# Patient Record
Sex: Male | Born: 1935
Health system: Southern US, Community
[De-identification: ages and names within clinical notes are randomized; demographics above are authoritative.]

## PROBLEM LIST (undated history)

## (undated) DIAGNOSIS — I10 Essential (primary) hypertension: Secondary | ICD-10-CM

## (undated) DIAGNOSIS — E785 Hyperlipidemia, unspecified: Secondary | ICD-10-CM

## (undated) DIAGNOSIS — C801 Malignant (primary) neoplasm, unspecified: Secondary | ICD-10-CM

---

## 2016-01-15 DIAGNOSIS — I10 Essential (primary) hypertension: Secondary | ICD-10-CM | POA: Diagnosis not present

## 2016-01-15 DIAGNOSIS — Z8546 Personal history of malignant neoplasm of prostate: Secondary | ICD-10-CM | POA: Diagnosis not present

## 2016-01-15 DIAGNOSIS — E782 Mixed hyperlipidemia: Secondary | ICD-10-CM | POA: Diagnosis not present

## 2016-01-15 DIAGNOSIS — R0602 Shortness of breath: Secondary | ICD-10-CM | POA: Diagnosis not present

## 2016-02-08 ENCOUNTER — Other Ambulatory Visit: Payer: Self-pay

## 2016-02-08 NOTE — Patient Outreach (Signed)
[facility]) Care Management  02/08/2016  Joshua Dixon [birth year] [id number]  Referral Date:  02/01/2016 Source:  [place] Rossetti [place] Issue:  "Mrs. Beets would like to speak with an in-home care Freight forwarder.  Would like to discuss [facility] & rehab resources."  Telephone Screen  Case Review:   Primary MD:  Dr. Josetta Huddle per chart and Dr. Cristy Folks per [place]. H/o hyperlipidemia, Essential HTN, H/o prostate cancer BP 118/66 Weight 159 Height: 72  BMI 21.56 Immunizations: unknown  Outreach call #1 to patient for Screening.  [phone] (H) and  provided by [facility] for wife.  Patient not reached. RN CM left HIPAA compliant voice message with name and number for call back. RN CM scheduled for next contact call within one week.   Mariann Laster, RN, BSN, [facility], CCM  [employer] Management Coordinator [phone] Direct [phone] Cell [phone] Office [phone] Fax

## 2016-02-11 ENCOUNTER — Other Ambulatory Visit: Payer: Self-pay

## 2016-02-11 DIAGNOSIS — R0789 Other chest pain: Secondary | ICD-10-CM | POA: Diagnosis not present

## 2016-02-11 DIAGNOSIS — F322 Major depressive disorder, single episode, severe without psychotic features: Secondary | ICD-10-CM | POA: Diagnosis not present

## 2016-02-11 DIAGNOSIS — I10 Essential (primary) hypertension: Secondary | ICD-10-CM | POA: Diagnosis not present

## 2016-02-11 DIAGNOSIS — Z8546 Personal history of malignant neoplasm of prostate: Secondary | ICD-10-CM | POA: Diagnosis not present

## 2016-02-11 DIAGNOSIS — R262 Difficulty in walking, not elsewhere classified: Secondary | ICD-10-CM | POA: Diagnosis not present

## 2016-02-11 NOTE — Patient Outreach (Signed)
[facility]) Care Management  02/11/2016  Raif Sarafin Mar 29, 1926 [id number]   Referral Date: 02/01/2016 Source: Mcarthur Rossetti [place] Issue: "Mrs. Ishee would like to speak with an in-home care Freight forwarder. Would like to discuss [facility] & rehab resources."  Telephone Screen  Case Review:  Primary MD: Dr. Josetta Huddle per chart and Dr. Cristy Folks per [place]. H/o hyperlipidemia, Essential HTN, H/o prostate cancer BP 118/66 Weight 159 Height: 72  BMI 21.56 Immunizations: unknown  Outreach call #2 to patient for Screening. [phone] (H). Patient not reached. RN CM left HIPAA compliant voice message with name and number for call back. RN CM scheduled for next contact call within one week.   Mariann Laster, RN, BSN, [facility], CCM  [employer] Management Coordinator [phone] Direct [phone] Cell [phone] Office [phone] Fax

## 2016-02-13 ENCOUNTER — Other Ambulatory Visit: Payer: Self-pay

## 2016-02-13 DIAGNOSIS — Z8546 Personal history of malignant neoplasm of prostate: Secondary | ICD-10-CM | POA: Diagnosis not present

## 2016-02-13 DIAGNOSIS — N183 Chronic kidney disease, stage 3 (moderate): Secondary | ICD-10-CM | POA: Diagnosis not present

## 2016-02-13 DIAGNOSIS — R262 Difficulty in walking, not elsewhere classified: Secondary | ICD-10-CM | POA: Diagnosis not present

## 2016-02-13 DIAGNOSIS — I129 Hypertensive chronic kidney disease with stage 1 through stage 4 chronic kidney disease, or unspecified chronic kidney disease: Secondary | ICD-10-CM | POA: Diagnosis not present

## 2016-02-13 DIAGNOSIS — F322 Major depressive disorder, single episode, severe without psychotic features: Secondary | ICD-10-CM | POA: Diagnosis not present

## 2016-02-13 NOTE — Patient Outreach (Signed)
[facility]) Care Management  02/13/2016  Natrone Mcquistion 10-Nov-1925 [id number]  Telephone Screen  Referral Date:  02/01/2016 Source:  [place] Issue: Patient would like to speak with an in-home care manager.   Outreach call #3 to patient for Screening at [phone] (H). Patient's wife, caregiver: Pascal Dey reached. Wife refused to verify DOB and address.   RN CM attempted to introduce [facility] services.  Patient stated she would not provide any additional information.   Plan:  RN CM notified [place] Management Assistant to close case: refused. RN CM notified Primary MD: Dr. Seward Carol (per [facility] referral) via case closure letter.  RN CM sent case closure letter to patient for additional verification.  Mariann Laster, RN, BSN, [facility], CCM  [employer] Management Coordinator [phone] Direct [phone] Cell [phone] Office [phone] Fax

## 2016-02-18 DIAGNOSIS — N183 Chronic kidney disease, stage 3 (moderate): Secondary | ICD-10-CM | POA: Diagnosis not present

## 2016-02-18 DIAGNOSIS — I129 Hypertensive chronic kidney disease with stage 1 through stage 4 chronic kidney disease, or unspecified chronic kidney disease: Secondary | ICD-10-CM | POA: Diagnosis not present

## 2016-02-18 DIAGNOSIS — Z8546 Personal history of malignant neoplasm of prostate: Secondary | ICD-10-CM | POA: Diagnosis not present

## 2016-02-18 DIAGNOSIS — R262 Difficulty in walking, not elsewhere classified: Secondary | ICD-10-CM | POA: Diagnosis not present

## 2016-02-18 DIAGNOSIS — F322 Major depressive disorder, single episode, severe without psychotic features: Secondary | ICD-10-CM | POA: Diagnosis not present

## 2016-02-22 DIAGNOSIS — I129 Hypertensive chronic kidney disease with stage 1 through stage 4 chronic kidney disease, or unspecified chronic kidney disease: Secondary | ICD-10-CM | POA: Diagnosis not present

## 2016-02-22 DIAGNOSIS — Z8546 Personal history of malignant neoplasm of prostate: Secondary | ICD-10-CM | POA: Diagnosis not present

## 2016-02-22 DIAGNOSIS — F322 Major depressive disorder, single episode, severe without psychotic features: Secondary | ICD-10-CM | POA: Diagnosis not present

## 2016-02-22 DIAGNOSIS — R262 Difficulty in walking, not elsewhere classified: Secondary | ICD-10-CM | POA: Diagnosis not present

## 2016-02-22 DIAGNOSIS — N183 Chronic kidney disease, stage 3 (moderate): Secondary | ICD-10-CM | POA: Diagnosis not present

## 2016-02-25 DIAGNOSIS — N183 Chronic kidney disease, stage 3 (moderate): Secondary | ICD-10-CM | POA: Diagnosis not present

## 2016-02-25 DIAGNOSIS — Z8546 Personal history of malignant neoplasm of prostate: Secondary | ICD-10-CM | POA: Diagnosis not present

## 2016-02-25 DIAGNOSIS — F322 Major depressive disorder, single episode, severe without psychotic features: Secondary | ICD-10-CM | POA: Diagnosis not present

## 2016-02-25 DIAGNOSIS — I129 Hypertensive chronic kidney disease with stage 1 through stage 4 chronic kidney disease, or unspecified chronic kidney disease: Secondary | ICD-10-CM | POA: Diagnosis not present

## 2016-02-25 DIAGNOSIS — R262 Difficulty in walking, not elsewhere classified: Secondary | ICD-10-CM | POA: Diagnosis not present

## 2016-02-27 DIAGNOSIS — I129 Hypertensive chronic kidney disease with stage 1 through stage 4 chronic kidney disease, or unspecified chronic kidney disease: Secondary | ICD-10-CM | POA: Diagnosis not present

## 2016-02-27 DIAGNOSIS — R262 Difficulty in walking, not elsewhere classified: Secondary | ICD-10-CM | POA: Diagnosis not present

## 2016-02-27 DIAGNOSIS — N183 Chronic kidney disease, stage 3 (moderate): Secondary | ICD-10-CM | POA: Diagnosis not present

## 2016-02-27 DIAGNOSIS — F322 Major depressive disorder, single episode, severe without psychotic features: Secondary | ICD-10-CM | POA: Diagnosis not present

## 2016-02-27 DIAGNOSIS — Z8546 Personal history of malignant neoplasm of prostate: Secondary | ICD-10-CM | POA: Diagnosis not present

## 2016-03-05 DIAGNOSIS — Z8546 Personal history of malignant neoplasm of prostate: Secondary | ICD-10-CM | POA: Diagnosis not present

## 2016-03-05 DIAGNOSIS — F322 Major depressive disorder, single episode, severe without psychotic features: Secondary | ICD-10-CM | POA: Diagnosis not present

## 2016-03-05 DIAGNOSIS — R262 Difficulty in walking, not elsewhere classified: Secondary | ICD-10-CM | POA: Diagnosis not present

## 2016-03-05 DIAGNOSIS — I129 Hypertensive chronic kidney disease with stage 1 through stage 4 chronic kidney disease, or unspecified chronic kidney disease: Secondary | ICD-10-CM | POA: Diagnosis not present

## 2016-03-05 DIAGNOSIS — N183 Chronic kidney disease, stage 3 (moderate): Secondary | ICD-10-CM | POA: Diagnosis not present

## 2016-03-07 DIAGNOSIS — F322 Major depressive disorder, single episode, severe without psychotic features: Secondary | ICD-10-CM | POA: Diagnosis not present

## 2016-03-07 DIAGNOSIS — I129 Hypertensive chronic kidney disease with stage 1 through stage 4 chronic kidney disease, or unspecified chronic kidney disease: Secondary | ICD-10-CM | POA: Diagnosis not present

## 2016-03-07 DIAGNOSIS — R262 Difficulty in walking, not elsewhere classified: Secondary | ICD-10-CM | POA: Diagnosis not present

## 2016-03-07 DIAGNOSIS — N183 Chronic kidney disease, stage 3 (moderate): Secondary | ICD-10-CM | POA: Diagnosis not present

## 2016-03-07 DIAGNOSIS — Z8546 Personal history of malignant neoplasm of prostate: Secondary | ICD-10-CM | POA: Diagnosis not present

## 2016-03-20 DIAGNOSIS — Z8546 Personal history of malignant neoplasm of prostate: Secondary | ICD-10-CM | POA: Diagnosis not present

## 2016-03-20 DIAGNOSIS — R262 Difficulty in walking, not elsewhere classified: Secondary | ICD-10-CM | POA: Diagnosis not present

## 2016-03-20 DIAGNOSIS — N183 Chronic kidney disease, stage 3 (moderate): Secondary | ICD-10-CM | POA: Diagnosis not present

## 2016-03-20 DIAGNOSIS — I129 Hypertensive chronic kidney disease with stage 1 through stage 4 chronic kidney disease, or unspecified chronic kidney disease: Secondary | ICD-10-CM | POA: Diagnosis not present

## 2016-03-20 DIAGNOSIS — F322 Major depressive disorder, single episode, severe without psychotic features: Secondary | ICD-10-CM | POA: Diagnosis not present

## 2016-03-24 DIAGNOSIS — H401231 Low-tension glaucoma, bilateral, mild stage: Secondary | ICD-10-CM | POA: Diagnosis not present

## 2016-03-27 DIAGNOSIS — F322 Major depressive disorder, single episode, severe without psychotic features: Secondary | ICD-10-CM | POA: Diagnosis not present

## 2016-03-27 DIAGNOSIS — R262 Difficulty in walking, not elsewhere classified: Secondary | ICD-10-CM | POA: Diagnosis not present

## 2016-03-27 DIAGNOSIS — I129 Hypertensive chronic kidney disease with stage 1 through stage 4 chronic kidney disease, or unspecified chronic kidney disease: Secondary | ICD-10-CM | POA: Diagnosis not present

## 2016-03-27 DIAGNOSIS — N183 Chronic kidney disease, stage 3 (moderate): Secondary | ICD-10-CM | POA: Diagnosis not present

## 2016-03-27 DIAGNOSIS — Z8546 Personal history of malignant neoplasm of prostate: Secondary | ICD-10-CM | POA: Diagnosis not present

## 2016-03-31 DIAGNOSIS — R3911 Hesitancy of micturition: Secondary | ICD-10-CM | POA: Diagnosis not present

## 2016-03-31 DIAGNOSIS — C61 Malignant neoplasm of prostate: Secondary | ICD-10-CM | POA: Diagnosis not present

## 2016-04-09 DIAGNOSIS — R262 Difficulty in walking, not elsewhere classified: Secondary | ICD-10-CM | POA: Diagnosis not present

## 2016-04-11 DIAGNOSIS — N183 Chronic kidney disease, stage 3 (moderate): Secondary | ICD-10-CM | POA: Diagnosis not present

## 2016-04-11 DIAGNOSIS — F322 Major depressive disorder, single episode, severe without psychotic features: Secondary | ICD-10-CM | POA: Diagnosis not present

## 2016-04-11 DIAGNOSIS — Z8546 Personal history of malignant neoplasm of prostate: Secondary | ICD-10-CM | POA: Diagnosis not present

## 2016-04-11 DIAGNOSIS — I129 Hypertensive chronic kidney disease with stage 1 through stage 4 chronic kidney disease, or unspecified chronic kidney disease: Secondary | ICD-10-CM | POA: Diagnosis not present

## 2016-04-11 DIAGNOSIS — R262 Difficulty in walking, not elsewhere classified: Secondary | ICD-10-CM | POA: Diagnosis not present

## 2016-04-16 DIAGNOSIS — F322 Major depressive disorder, single episode, severe without psychotic features: Secondary | ICD-10-CM | POA: Diagnosis not present

## 2016-04-16 DIAGNOSIS — Z8546 Personal history of malignant neoplasm of prostate: Secondary | ICD-10-CM | POA: Diagnosis not present

## 2016-04-16 DIAGNOSIS — N183 Chronic kidney disease, stage 3 (moderate): Secondary | ICD-10-CM | POA: Diagnosis not present

## 2016-04-16 DIAGNOSIS — I129 Hypertensive chronic kidney disease with stage 1 through stage 4 chronic kidney disease, or unspecified chronic kidney disease: Secondary | ICD-10-CM | POA: Diagnosis not present

## 2016-04-16 DIAGNOSIS — R262 Difficulty in walking, not elsewhere classified: Secondary | ICD-10-CM | POA: Diagnosis not present

## 2016-04-18 DIAGNOSIS — N183 Chronic kidney disease, stage 3 (moderate): Secondary | ICD-10-CM | POA: Diagnosis not present

## 2016-04-18 DIAGNOSIS — I129 Hypertensive chronic kidney disease with stage 1 through stage 4 chronic kidney disease, or unspecified chronic kidney disease: Secondary | ICD-10-CM | POA: Diagnosis not present

## 2016-04-18 DIAGNOSIS — R262 Difficulty in walking, not elsewhere classified: Secondary | ICD-10-CM | POA: Diagnosis not present

## 2016-04-18 DIAGNOSIS — Z8546 Personal history of malignant neoplasm of prostate: Secondary | ICD-10-CM | POA: Diagnosis not present

## 2016-04-18 DIAGNOSIS — F322 Major depressive disorder, single episode, severe without psychotic features: Secondary | ICD-10-CM | POA: Diagnosis not present

## 2016-04-25 DIAGNOSIS — N183 Chronic kidney disease, stage 3 (moderate): Secondary | ICD-10-CM | POA: Diagnosis not present

## 2016-04-25 DIAGNOSIS — F322 Major depressive disorder, single episode, severe without psychotic features: Secondary | ICD-10-CM | POA: Diagnosis not present

## 2016-04-25 DIAGNOSIS — Z8546 Personal history of malignant neoplasm of prostate: Secondary | ICD-10-CM | POA: Diagnosis not present

## 2016-04-25 DIAGNOSIS — R262 Difficulty in walking, not elsewhere classified: Secondary | ICD-10-CM | POA: Diagnosis not present

## 2016-04-25 DIAGNOSIS — I129 Hypertensive chronic kidney disease with stage 1 through stage 4 chronic kidney disease, or unspecified chronic kidney disease: Secondary | ICD-10-CM | POA: Diagnosis not present

## 2016-05-15 DIAGNOSIS — I10 Essential (primary) hypertension: Secondary | ICD-10-CM | POA: Diagnosis not present

## 2016-05-15 DIAGNOSIS — R5383 Other fatigue: Secondary | ICD-10-CM | POA: Diagnosis not present

## 2016-05-15 DIAGNOSIS — D649 Anemia, unspecified: Secondary | ICD-10-CM | POA: Diagnosis not present

## 2016-05-15 DIAGNOSIS — E7800 Pure hypercholesterolemia, unspecified: Secondary | ICD-10-CM | POA: Diagnosis not present

## 2016-05-15 DIAGNOSIS — Z8546 Personal history of malignant neoplasm of prostate: Secondary | ICD-10-CM | POA: Diagnosis not present

## 2016-05-15 DIAGNOSIS — E039 Hypothyroidism, unspecified: Secondary | ICD-10-CM | POA: Diagnosis not present

## 2016-05-15 DIAGNOSIS — M810 Age-related osteoporosis without current pathological fracture: Secondary | ICD-10-CM | POA: Diagnosis not present

## 2016-05-15 DIAGNOSIS — N183 Chronic kidney disease, stage 3 (moderate): Secondary | ICD-10-CM | POA: Diagnosis not present

## 2016-05-16 DIAGNOSIS — Z8546 Personal history of malignant neoplasm of prostate: Secondary | ICD-10-CM | POA: Diagnosis not present

## 2016-05-16 DIAGNOSIS — N183 Chronic kidney disease, stage 3 (moderate): Secondary | ICD-10-CM | POA: Diagnosis not present

## 2016-05-16 DIAGNOSIS — F322 Major depressive disorder, single episode, severe without psychotic features: Secondary | ICD-10-CM | POA: Diagnosis not present

## 2016-05-16 DIAGNOSIS — R262 Difficulty in walking, not elsewhere classified: Secondary | ICD-10-CM | POA: Diagnosis not present

## 2016-05-16 DIAGNOSIS — I129 Hypertensive chronic kidney disease with stage 1 through stage 4 chronic kidney disease, or unspecified chronic kidney disease: Secondary | ICD-10-CM | POA: Diagnosis not present

## 2016-05-20 DIAGNOSIS — Z8546 Personal history of malignant neoplasm of prostate: Secondary | ICD-10-CM | POA: Diagnosis not present

## 2016-05-20 DIAGNOSIS — R262 Difficulty in walking, not elsewhere classified: Secondary | ICD-10-CM | POA: Diagnosis not present

## 2016-05-20 DIAGNOSIS — F322 Major depressive disorder, single episode, severe without psychotic features: Secondary | ICD-10-CM | POA: Diagnosis not present

## 2016-05-20 DIAGNOSIS — I129 Hypertensive chronic kidney disease with stage 1 through stage 4 chronic kidney disease, or unspecified chronic kidney disease: Secondary | ICD-10-CM | POA: Diagnosis not present

## 2016-05-20 DIAGNOSIS — N183 Chronic kidney disease, stage 3 (moderate): Secondary | ICD-10-CM | POA: Diagnosis not present

## 2016-05-23 DIAGNOSIS — Z1211 Encounter for screening for malignant neoplasm of colon: Secondary | ICD-10-CM | POA: Diagnosis not present

## 2016-05-30 DIAGNOSIS — C61 Malignant neoplasm of prostate: Secondary | ICD-10-CM | POA: Diagnosis not present

## 2016-06-06 DIAGNOSIS — C61 Malignant neoplasm of prostate: Secondary | ICD-10-CM | POA: Diagnosis not present

## 2016-06-20 DIAGNOSIS — M810 Age-related osteoporosis without current pathological fracture: Secondary | ICD-10-CM | POA: Diagnosis not present

## 2016-06-20 DIAGNOSIS — Z23 Encounter for immunization: Secondary | ICD-10-CM | POA: Diagnosis not present

## 2016-08-12 DIAGNOSIS — N183 Chronic kidney disease, stage 3 (moderate): Secondary | ICD-10-CM | POA: Diagnosis not present

## 2016-08-12 DIAGNOSIS — R05 Cough: Secondary | ICD-10-CM | POA: Diagnosis not present

## 2016-08-12 DIAGNOSIS — E7800 Pure hypercholesterolemia, unspecified: Secondary | ICD-10-CM | POA: Diagnosis not present

## 2016-08-12 DIAGNOSIS — D638 Anemia in other chronic diseases classified elsewhere: Secondary | ICD-10-CM | POA: Diagnosis not present

## 2016-08-12 DIAGNOSIS — R630 Anorexia: Secondary | ICD-10-CM | POA: Diagnosis not present

## 2016-12-02 DIAGNOSIS — H401231 Low-tension glaucoma, bilateral, mild stage: Secondary | ICD-10-CM | POA: Diagnosis not present

## 2016-12-09 DIAGNOSIS — E7800 Pure hypercholesterolemia, unspecified: Secondary | ICD-10-CM | POA: Diagnosis not present

## 2016-12-09 DIAGNOSIS — D638 Anemia in other chronic diseases classified elsewhere: Secondary | ICD-10-CM | POA: Diagnosis not present

## 2016-12-09 DIAGNOSIS — N183 Chronic kidney disease, stage 3 (moderate): Secondary | ICD-10-CM | POA: Diagnosis not present

## 2016-12-09 DIAGNOSIS — I10 Essential (primary) hypertension: Secondary | ICD-10-CM | POA: Diagnosis not present

## 2017-01-09 DIAGNOSIS — M810 Age-related osteoporosis without current pathological fracture: Secondary | ICD-10-CM | POA: Diagnosis not present

## 2017-01-20 DIAGNOSIS — C61 Malignant neoplasm of prostate: Secondary | ICD-10-CM | POA: Diagnosis not present

## 2017-01-27 DIAGNOSIS — C61 Malignant neoplasm of prostate: Secondary | ICD-10-CM | POA: Diagnosis not present

## 2017-02-10 DIAGNOSIS — Z5111 Encounter for antineoplastic chemotherapy: Secondary | ICD-10-CM | POA: Diagnosis not present

## 2017-02-10 DIAGNOSIS — C61 Malignant neoplasm of prostate: Secondary | ICD-10-CM | POA: Diagnosis not present

## 2017-02-28 DIAGNOSIS — H1011 Acute atopic conjunctivitis, right eye: Secondary | ICD-10-CM | POA: Diagnosis not present

## 2017-02-28 DIAGNOSIS — H01003 Unspecified blepharitis right eye, unspecified eyelid: Secondary | ICD-10-CM | POA: Diagnosis not present

## 2017-04-10 ENCOUNTER — Ambulatory Visit
Admission: RE | Admit: 2017-04-10 | Discharge: 2017-04-10 | Disposition: A | Discharge status: Home / Self Care | Payer: Commercial Managed Care - HMO | Source: Ambulatory Visit | Attending: Internal Medicine | Admitting: Internal Medicine

## 2017-04-10 ENCOUNTER — Other Ambulatory Visit: Payer: Self-pay | Admitting: Internal Medicine

## 2017-04-10 DIAGNOSIS — M546 Pain in thoracic spine: Secondary | ICD-10-CM

## 2017-04-10 DIAGNOSIS — M47814 Spondylosis without myelopathy or radiculopathy, thoracic region: Secondary | ICD-10-CM | POA: Diagnosis not present

## 2017-04-10 DIAGNOSIS — M810 Age-related osteoporosis without current pathological fracture: Secondary | ICD-10-CM | POA: Diagnosis not present

## 2017-04-10 DIAGNOSIS — F329 Major depressive disorder, single episode, unspecified: Secondary | ICD-10-CM | POA: Diagnosis not present

## 2017-05-21 ENCOUNTER — Other Ambulatory Visit: Payer: Self-pay | Admitting: Internal Medicine

## 2017-05-21 ENCOUNTER — Ambulatory Visit
Admission: RE | Admit: 2017-05-21 | Discharge: 2017-05-21 | Disposition: A | Discharge status: Home / Self Care | Payer: Medicare HMO | Source: Ambulatory Visit | Attending: Internal Medicine | Admitting: Internal Medicine

## 2017-05-21 DIAGNOSIS — R059 Cough, unspecified: Secondary | ICD-10-CM

## 2017-05-21 DIAGNOSIS — R05 Cough: Secondary | ICD-10-CM | POA: Diagnosis not present

## 2017-05-27 DIAGNOSIS — H401231 Low-tension glaucoma, bilateral, mild stage: Secondary | ICD-10-CM | POA: Diagnosis not present

## 2017-06-25 DIAGNOSIS — H401231 Low-tension glaucoma, bilateral, mild stage: Secondary | ICD-10-CM | POA: Diagnosis not present

## 2017-06-26 DIAGNOSIS — Z0000 Encounter for general adult medical examination without abnormal findings: Secondary | ICD-10-CM | POA: Diagnosis not present

## 2017-06-26 DIAGNOSIS — E7800 Pure hypercholesterolemia, unspecified: Secondary | ICD-10-CM | POA: Diagnosis not present

## 2017-06-26 DIAGNOSIS — E876 Hypokalemia: Secondary | ICD-10-CM | POA: Diagnosis not present

## 2017-06-26 DIAGNOSIS — R05 Cough: Secondary | ICD-10-CM | POA: Diagnosis not present

## 2017-06-26 DIAGNOSIS — N183 Chronic kidney disease, stage 3 (moderate): Secondary | ICD-10-CM | POA: Diagnosis not present

## 2017-06-26 DIAGNOSIS — Z23 Encounter for immunization: Secondary | ICD-10-CM | POA: Diagnosis not present

## 2017-06-26 DIAGNOSIS — R262 Difficulty in walking, not elsewhere classified: Secondary | ICD-10-CM | POA: Diagnosis not present

## 2017-06-26 DIAGNOSIS — M810 Age-related osteoporosis without current pathological fracture: Secondary | ICD-10-CM | POA: Diagnosis not present

## 2017-06-26 DIAGNOSIS — I10 Essential (primary) hypertension: Secondary | ICD-10-CM | POA: Diagnosis not present

## 2017-06-26 DIAGNOSIS — E039 Hypothyroidism, unspecified: Secondary | ICD-10-CM | POA: Diagnosis not present

## 2017-06-26 DIAGNOSIS — Z1389 Encounter for screening for other disorder: Secondary | ICD-10-CM | POA: Diagnosis not present

## 2017-06-30 DIAGNOSIS — E876 Hypokalemia: Secondary | ICD-10-CM | POA: Diagnosis not present

## 2017-07-02 DIAGNOSIS — E876 Hypokalemia: Secondary | ICD-10-CM | POA: Diagnosis not present

## 2017-07-13 DIAGNOSIS — M810 Age-related osteoporosis without current pathological fracture: Secondary | ICD-10-CM | POA: Diagnosis not present

## 2017-07-13 DIAGNOSIS — E876 Hypokalemia: Secondary | ICD-10-CM | POA: Diagnosis not present

## 2017-07-21 DIAGNOSIS — M810 Age-related osteoporosis without current pathological fracture: Secondary | ICD-10-CM | POA: Diagnosis not present

## 2017-07-21 DIAGNOSIS — R262 Difficulty in walking, not elsewhere classified: Secondary | ICD-10-CM | POA: Diagnosis not present

## 2017-07-21 DIAGNOSIS — N183 Chronic kidney disease, stage 3 (moderate): Secondary | ICD-10-CM | POA: Diagnosis not present

## 2017-07-21 DIAGNOSIS — M546 Pain in thoracic spine: Secondary | ICD-10-CM | POA: Diagnosis not present

## 2017-08-12 DIAGNOSIS — C61 Malignant neoplasm of prostate: Secondary | ICD-10-CM | POA: Diagnosis not present

## 2017-08-18 DIAGNOSIS — C61 Malignant neoplasm of prostate: Secondary | ICD-10-CM | POA: Diagnosis not present

## 2017-08-18 DIAGNOSIS — R3911 Hesitancy of micturition: Secondary | ICD-10-CM | POA: Diagnosis not present

## 2017-08-20 DIAGNOSIS — H019 Unspecified inflammation of eyelid: Secondary | ICD-10-CM | POA: Diagnosis not present

## 2017-08-21 DIAGNOSIS — M546 Pain in thoracic spine: Secondary | ICD-10-CM | POA: Diagnosis not present

## 2017-08-21 DIAGNOSIS — N183 Chronic kidney disease, stage 3 (moderate): Secondary | ICD-10-CM | POA: Diagnosis not present

## 2017-08-21 DIAGNOSIS — M810 Age-related osteoporosis without current pathological fracture: Secondary | ICD-10-CM | POA: Diagnosis not present

## 2017-08-21 DIAGNOSIS — R262 Difficulty in walking, not elsewhere classified: Secondary | ICD-10-CM | POA: Diagnosis not present

## 2017-09-11 DIAGNOSIS — H401134 Primary open-angle glaucoma, bilateral, indeterminate stage: Secondary | ICD-10-CM | POA: Diagnosis not present

## 2017-09-11 DIAGNOSIS — H43813 Vitreous degeneration, bilateral: Secondary | ICD-10-CM | POA: Diagnosis not present

## 2017-09-11 DIAGNOSIS — H02102 Unspecified ectropion of right lower eyelid: Secondary | ICD-10-CM | POA: Diagnosis not present

## 2017-09-11 DIAGNOSIS — H26493 Other secondary cataract, bilateral: Secondary | ICD-10-CM | POA: Diagnosis not present

## 2017-09-20 DIAGNOSIS — M810 Age-related osteoporosis without current pathological fracture: Secondary | ICD-10-CM | POA: Diagnosis not present

## 2017-09-20 DIAGNOSIS — R262 Difficulty in walking, not elsewhere classified: Secondary | ICD-10-CM | POA: Diagnosis not present

## 2017-09-20 DIAGNOSIS — M546 Pain in thoracic spine: Secondary | ICD-10-CM | POA: Diagnosis not present

## 2017-09-20 DIAGNOSIS — N183 Chronic kidney disease, stage 3 (moderate): Secondary | ICD-10-CM | POA: Diagnosis not present

## 2017-10-15 DIAGNOSIS — H26491 Other secondary cataract, right eye: Secondary | ICD-10-CM | POA: Diagnosis not present

## 2017-10-21 DIAGNOSIS — N183 Chronic kidney disease, stage 3 (moderate): Secondary | ICD-10-CM | POA: Diagnosis not present

## 2017-10-21 DIAGNOSIS — M810 Age-related osteoporosis without current pathological fracture: Secondary | ICD-10-CM | POA: Diagnosis not present

## 2017-10-21 DIAGNOSIS — M546 Pain in thoracic spine: Secondary | ICD-10-CM | POA: Diagnosis not present

## 2017-10-21 DIAGNOSIS — R262 Difficulty in walking, not elsewhere classified: Secondary | ICD-10-CM | POA: Diagnosis not present

## 2017-11-13 ENCOUNTER — Other Ambulatory Visit (HOSPITAL_COMMUNITY): Payer: Self-pay | Admitting: Internal Medicine

## 2017-11-13 DIAGNOSIS — R1310 Dysphagia, unspecified: Secondary | ICD-10-CM

## 2017-11-13 DIAGNOSIS — R05 Cough: Secondary | ICD-10-CM | POA: Diagnosis not present

## 2017-11-21 DIAGNOSIS — R262 Difficulty in walking, not elsewhere classified: Secondary | ICD-10-CM | POA: Diagnosis not present

## 2017-11-21 DIAGNOSIS — N183 Chronic kidney disease, stage 3 (moderate): Secondary | ICD-10-CM | POA: Diagnosis not present

## 2017-11-21 DIAGNOSIS — M810 Age-related osteoporosis without current pathological fracture: Secondary | ICD-10-CM | POA: Diagnosis not present

## 2017-11-21 DIAGNOSIS — M546 Pain in thoracic spine: Secondary | ICD-10-CM | POA: Diagnosis not present

## 2017-11-25 ENCOUNTER — Ambulatory Visit (HOSPITAL_COMMUNITY): Payer: Medicare HMO

## 2017-11-25 ENCOUNTER — Other Ambulatory Visit (HOSPITAL_COMMUNITY): Payer: Medicare HMO

## 2017-11-30 ENCOUNTER — Ambulatory Visit (HOSPITAL_COMMUNITY): Payer: Medicare HMO

## 2017-11-30 ENCOUNTER — Other Ambulatory Visit (HOSPITAL_COMMUNITY): Payer: Medicare HMO

## 2017-12-02 DIAGNOSIS — N183 Chronic kidney disease, stage 3 (moderate): Secondary | ICD-10-CM | POA: Diagnosis not present

## 2017-12-02 DIAGNOSIS — I10 Essential (primary) hypertension: Secondary | ICD-10-CM | POA: Diagnosis not present

## 2017-12-02 DIAGNOSIS — H409 Unspecified glaucoma: Secondary | ICD-10-CM | POA: Diagnosis not present

## 2017-12-02 DIAGNOSIS — E782 Mixed hyperlipidemia: Secondary | ICD-10-CM | POA: Diagnosis not present

## 2017-12-02 DIAGNOSIS — M810 Age-related osteoporosis without current pathological fracture: Secondary | ICD-10-CM | POA: Diagnosis not present

## 2017-12-02 DIAGNOSIS — N400 Enlarged prostate without lower urinary tract symptoms: Secondary | ICD-10-CM | POA: Diagnosis not present

## 2017-12-02 DIAGNOSIS — E039 Hypothyroidism, unspecified: Secondary | ICD-10-CM | POA: Diagnosis not present

## 2017-12-02 DIAGNOSIS — F322 Major depressive disorder, single episode, severe without psychotic features: Secondary | ICD-10-CM | POA: Diagnosis not present

## 2017-12-08 ENCOUNTER — Ambulatory Visit (HOSPITAL_COMMUNITY): Payer: Medicare HMO

## 2017-12-09 ENCOUNTER — Ambulatory Visit (HOSPITAL_COMMUNITY): Admission: RE | Admit: 2017-12-09 | Payer: Medicare HMO | Source: Ambulatory Visit

## 2017-12-09 ENCOUNTER — Other Ambulatory Visit (HOSPITAL_COMMUNITY): Payer: Medicare HMO

## 2017-12-09 ENCOUNTER — Ambulatory Visit (HOSPITAL_COMMUNITY): Payer: Medicare HMO

## 2017-12-19 DIAGNOSIS — R262 Difficulty in walking, not elsewhere classified: Secondary | ICD-10-CM | POA: Diagnosis not present

## 2017-12-19 DIAGNOSIS — M810 Age-related osteoporosis without current pathological fracture: Secondary | ICD-10-CM | POA: Diagnosis not present

## 2017-12-19 DIAGNOSIS — M546 Pain in thoracic spine: Secondary | ICD-10-CM | POA: Diagnosis not present

## 2017-12-19 DIAGNOSIS — N183 Chronic kidney disease, stage 3 (moderate): Secondary | ICD-10-CM | POA: Diagnosis not present

## 2017-12-30 DIAGNOSIS — E782 Mixed hyperlipidemia: Secondary | ICD-10-CM | POA: Diagnosis not present

## 2017-12-30 DIAGNOSIS — D638 Anemia in other chronic diseases classified elsewhere: Secondary | ICD-10-CM | POA: Diagnosis not present

## 2017-12-30 DIAGNOSIS — F322 Major depressive disorder, single episode, severe without psychotic features: Secondary | ICD-10-CM | POA: Diagnosis not present

## 2017-12-30 DIAGNOSIS — I10 Essential (primary) hypertension: Secondary | ICD-10-CM | POA: Diagnosis not present

## 2017-12-30 DIAGNOSIS — E039 Hypothyroidism, unspecified: Secondary | ICD-10-CM | POA: Diagnosis not present

## 2017-12-30 DIAGNOSIS — M810 Age-related osteoporosis without current pathological fracture: Secondary | ICD-10-CM | POA: Diagnosis not present

## 2017-12-30 DIAGNOSIS — N183 Chronic kidney disease, stage 3 (moderate): Secondary | ICD-10-CM | POA: Diagnosis not present

## 2017-12-30 DIAGNOSIS — Z8546 Personal history of malignant neoplasm of prostate: Secondary | ICD-10-CM | POA: Diagnosis not present

## 2018-01-15 DIAGNOSIS — E7800 Pure hypercholesterolemia, unspecified: Secondary | ICD-10-CM | POA: Diagnosis not present

## 2018-01-15 DIAGNOSIS — N183 Chronic kidney disease, stage 3 (moderate): Secondary | ICD-10-CM | POA: Diagnosis not present

## 2018-01-15 DIAGNOSIS — M810 Age-related osteoporosis without current pathological fracture: Secondary | ICD-10-CM | POA: Diagnosis not present

## 2018-01-15 DIAGNOSIS — E039 Hypothyroidism, unspecified: Secondary | ICD-10-CM | POA: Diagnosis not present

## 2018-01-19 DIAGNOSIS — R262 Difficulty in walking, not elsewhere classified: Secondary | ICD-10-CM | POA: Diagnosis not present

## 2018-01-19 DIAGNOSIS — N183 Chronic kidney disease, stage 3 (moderate): Secondary | ICD-10-CM | POA: Diagnosis not present

## 2018-01-19 DIAGNOSIS — M546 Pain in thoracic spine: Secondary | ICD-10-CM | POA: Diagnosis not present

## 2018-01-19 DIAGNOSIS — M810 Age-related osteoporosis without current pathological fracture: Secondary | ICD-10-CM | POA: Diagnosis not present

## 2018-02-03 ENCOUNTER — Emergency Department (HOSPITAL_COMMUNITY): Payer: Medicare HMO

## 2018-02-03 ENCOUNTER — Inpatient Hospital Stay (HOSPITAL_COMMUNITY)
Admission: EM | Admit: 2018-02-03 | Discharge: 2018-02-08 | DRG: 871 | Disposition: A | Discharge status: Hospice | Payer: Medicare HMO | Attending: Internal Medicine | Admitting: Internal Medicine

## 2018-02-03 ENCOUNTER — Other Ambulatory Visit: Payer: Self-pay

## 2018-02-03 ENCOUNTER — Encounter (HOSPITAL_COMMUNITY): Payer: Self-pay

## 2018-02-03 DIAGNOSIS — Z7189 Other specified counseling: Secondary | ICD-10-CM | POA: Diagnosis not present

## 2018-02-03 DIAGNOSIS — F329 Major depressive disorder, single episode, unspecified: Secondary | ICD-10-CM | POA: Diagnosis present

## 2018-02-03 DIAGNOSIS — D630 Anemia in neoplastic disease: Secondary | ICD-10-CM | POA: Diagnosis present

## 2018-02-03 DIAGNOSIS — W19XXXA Unspecified fall, initial encounter: Secondary | ICD-10-CM | POA: Diagnosis not present

## 2018-02-03 DIAGNOSIS — Z79890 Hormone replacement therapy (postmenopausal): Secondary | ICD-10-CM

## 2018-02-03 DIAGNOSIS — J9601 Acute respiratory failure with hypoxia: Secondary | ICD-10-CM | POA: Diagnosis not present

## 2018-02-03 DIAGNOSIS — R6889 Other general symptoms and signs: Secondary | ICD-10-CM | POA: Diagnosis not present

## 2018-02-03 DIAGNOSIS — N400 Enlarged prostate without lower urinary tract symptoms: Secondary | ICD-10-CM

## 2018-02-03 DIAGNOSIS — Z515 Encounter for palliative care: Secondary | ICD-10-CM

## 2018-02-03 DIAGNOSIS — J189 Pneumonia, unspecified organism: Secondary | ICD-10-CM | POA: Diagnosis not present

## 2018-02-03 DIAGNOSIS — R0600 Dyspnea, unspecified: Secondary | ICD-10-CM

## 2018-02-03 DIAGNOSIS — E785 Hyperlipidemia, unspecified: Secondary | ICD-10-CM

## 2018-02-03 DIAGNOSIS — Z7401 Bed confinement status: Secondary | ICD-10-CM

## 2018-02-03 DIAGNOSIS — J811 Chronic pulmonary edema: Secondary | ICD-10-CM | POA: Diagnosis not present

## 2018-02-03 DIAGNOSIS — I4891 Unspecified atrial fibrillation: Secondary | ICD-10-CM | POA: Diagnosis not present

## 2018-02-03 DIAGNOSIS — J9621 Acute and chronic respiratory failure with hypoxia: Secondary | ICD-10-CM | POA: Diagnosis not present

## 2018-02-03 DIAGNOSIS — J9811 Atelectasis: Secondary | ICD-10-CM | POA: Diagnosis not present

## 2018-02-03 DIAGNOSIS — K8020 Calculus of gallbladder without cholecystitis without obstruction: Secondary | ICD-10-CM | POA: Diagnosis not present

## 2018-02-03 DIAGNOSIS — E8770 Fluid overload, unspecified: Secondary | ICD-10-CM | POA: Diagnosis present

## 2018-02-03 DIAGNOSIS — C61 Malignant neoplasm of prostate: Secondary | ICD-10-CM | POA: Diagnosis not present

## 2018-02-03 DIAGNOSIS — R748 Abnormal levels of other serum enzymes: Secondary | ICD-10-CM | POA: Diagnosis not present

## 2018-02-03 DIAGNOSIS — R410 Disorientation, unspecified: Secondary | ICD-10-CM | POA: Diagnosis present

## 2018-02-03 DIAGNOSIS — E039 Hypothyroidism, unspecified: Secondary | ICD-10-CM | POA: Diagnosis not present

## 2018-02-03 DIAGNOSIS — Z66 Do not resuscitate: Secondary | ICD-10-CM | POA: Diagnosis present

## 2018-02-03 DIAGNOSIS — A419 Sepsis, unspecified organism: Secondary | ICD-10-CM | POA: Diagnosis not present

## 2018-02-03 DIAGNOSIS — Z23 Encounter for immunization: Secondary | ICD-10-CM | POA: Diagnosis not present

## 2018-02-03 DIAGNOSIS — G309 Alzheimer's disease, unspecified: Secondary | ICD-10-CM | POA: Diagnosis present

## 2018-02-03 DIAGNOSIS — R6521 Severe sepsis with septic shock: Secondary | ICD-10-CM | POA: Diagnosis not present

## 2018-02-03 DIAGNOSIS — M810 Age-related osteoporosis without current pathological fracture: Secondary | ICD-10-CM | POA: Diagnosis present

## 2018-02-03 DIAGNOSIS — R0603 Acute respiratory distress: Secondary | ICD-10-CM | POA: Diagnosis not present

## 2018-02-03 DIAGNOSIS — I10 Essential (primary) hypertension: Secondary | ICD-10-CM | POA: Diagnosis present

## 2018-02-03 DIAGNOSIS — F0280 Dementia in other diseases classified elsewhere without behavioral disturbance: Secondary | ICD-10-CM | POA: Diagnosis present

## 2018-02-03 DIAGNOSIS — H409 Unspecified glaucoma: Secondary | ICD-10-CM | POA: Diagnosis present

## 2018-02-03 DIAGNOSIS — M1990 Unspecified osteoarthritis, unspecified site: Secondary | ICD-10-CM | POA: Diagnosis present

## 2018-02-03 DIAGNOSIS — K819 Cholecystitis, unspecified: Secondary | ICD-10-CM | POA: Diagnosis present

## 2018-02-03 DIAGNOSIS — N390 Urinary tract infection, site not specified: Secondary | ICD-10-CM | POA: Diagnosis not present

## 2018-02-03 DIAGNOSIS — R404 Transient alteration of awareness: Secondary | ICD-10-CM | POA: Diagnosis not present

## 2018-02-03 DIAGNOSIS — G9341 Metabolic encephalopathy: Secondary | ICD-10-CM | POA: Diagnosis present

## 2018-02-03 DIAGNOSIS — R4182 Altered mental status, unspecified: Secondary | ICD-10-CM | POA: Diagnosis not present

## 2018-02-03 DIAGNOSIS — Z79899 Other long term (current) drug therapy: Secondary | ICD-10-CM

## 2018-02-03 DIAGNOSIS — J9690 Respiratory failure, unspecified, unspecified whether with hypoxia or hypercapnia: Secondary | ICD-10-CM | POA: Diagnosis not present

## 2018-02-03 HISTORY — DX: Hyperlipidemia, unspecified: E78.5

## 2018-02-03 HISTORY — DX: Essential (primary) hypertension: I10

## 2018-02-03 HISTORY — DX: Malignant (primary) neoplasm, unspecified: C80.1

## 2018-02-03 LAB — COMPREHENSIVE METABOLIC PANEL
ALBUMIN: 3.6 g/dL (ref 3.5–5.0)
ALT: 35 U/L (ref 17–63)
AST: 72 U/L — AB (ref 15–41)
Alkaline Phosphatase: 79 U/L (ref 38–126)
Anion gap: 10 (ref 5–15)
BUN: 15 mg/dL (ref 6–20)
CHLORIDE: 110 mmol/L (ref 101–111)
CO2: 22 mmol/L (ref 22–32)
Calcium: 8.6 mg/dL — ABNORMAL LOW (ref 8.9–10.3)
Creatinine, Ser: 1.15 mg/dL (ref 0.61–1.24)
GFR calc Af Amer: >60 mL/min (ref >60)
GFR, EST NON AFRICAN AMERICAN: 54 mL/min — AB (ref >60)
GLUCOSE: 124 mg/dL — AB (ref 65–99)
POTASSIUM: 3.5 mmol/L (ref 3.5–5.1)
SODIUM: 142 mmol/L (ref 135–145)
Total Bilirubin: 3 mg/dL — ABNORMAL HIGH (ref 0.3–1.2)
Total Protein: 6.9 g/dL (ref 6.5–8.1)

## 2018-02-03 LAB — CBC WITH DIFFERENTIAL/PLATELET
Basophils Absolute: 0 10*3/uL (ref 0.0–0.1)
Basophils Relative: 0 %
Eosinophils Absolute: 0 10*3/uL (ref 0.0–0.7)
Eosinophils Relative: 0 %
HCT: 30.1 % — ABNORMAL LOW (ref 39.0–52.0)
HEMOGLOBIN: 9.8 g/dL — AB (ref 13.0–17.0)
LYMPHS PCT: 4 %
Lymphs Abs: 0.5 10*3/uL — ABNORMAL LOW (ref 0.7–4.0)
MCH: 29.8 pg (ref 26.0–34.0)
MCHC: 32.6 g/dL (ref 30.0–36.0)
MCV: 91.5 fL (ref 78.0–100.0)
MONOS PCT: 4 %
Monocytes Absolute: 0.5 10*3/uL (ref 0.1–1.0)
NEUTROS ABS: 11.1 10*3/uL — AB (ref 1.7–7.7)
Neutrophils Relative %: 92 %
Platelets: 142 10*3/uL — ABNORMAL LOW (ref 150–400)
RBC: 3.29 MIL/uL — ABNORMAL LOW (ref 4.22–5.81)
RDW: 21 % — ABNORMAL HIGH (ref 11.5–15.5)
WBC: 12.1 10*3/uL — ABNORMAL HIGH (ref 4.0–10.5)

## 2018-02-03 LAB — URINALYSIS, ROUTINE W REFLEX MICROSCOPIC
Bilirubin Urine: NEGATIVE
Glucose, UA: NEGATIVE mg/dL
Hgb urine dipstick: NEGATIVE
KETONES UR: NEGATIVE mg/dL
LEUKOCYTES UA: NEGATIVE
NITRITE: NEGATIVE
PH: 7 (ref 5.0–8.0)
PROTEIN: NEGATIVE mg/dL
Specific Gravity, Urine: 1.014 (ref 1.005–1.030)

## 2018-02-03 LAB — INFLUENZA PANEL BY PCR (TYPE A & B)
Influenza A By PCR: NEGATIVE
Influenza B By PCR: NEGATIVE

## 2018-02-03 LAB — LACTIC ACID, PLASMA
LACTIC ACID, VENOUS: 1.7 mmol/L (ref 0.5–1.9)
LACTIC ACID, VENOUS: 1.6 mmol/L (ref 0.5–1.9)

## 2018-02-03 LAB — PROTIME-INR
INR: 1.16
Prothrombin Time: 14.7 seconds (ref 11.4–15.2)

## 2018-02-03 LAB — MRSA PCR SCREENING: MRSA by PCR: NEGATIVE

## 2018-02-03 LAB — I-STAT CG4 LACTIC ACID, ED: LACTIC ACID, VENOUS: 1.53 mmol/L (ref 0.5–1.9)

## 2018-02-03 LAB — PROCALCITONIN: PROCALCITONIN: 0.3 ng/mL

## 2018-02-03 MED ORDER — ALBUTEROL SULFATE (2.5 MG/3ML) 0.083% IN NEBU
2.5000 mg | INHALATION_SOLUTION | RESPIRATORY_TRACT | Status: DC | PRN
  Administered 2018-02-04: 2.5 mg via RESPIRATORY_TRACT
  Filled 2018-02-03: qty 3

## 2018-02-03 MED ORDER — SODIUM CHLORIDE 0.9 % IV BOLUS
1000.0000 mL | Freq: Once | INTRAVENOUS | Status: AC
  Administered 2018-02-03: 1000 mL via INTRAVENOUS

## 2018-02-03 MED ORDER — SODIUM CHLORIDE 0.9% FLUSH: 3.0000 mL | INTRAVENOUS | Status: DC | PRN

## 2018-02-03 MED ORDER — SODIUM CHLORIDE 0.9 % IV SOLN
INTRAVENOUS | Status: DC
  Administered 2018-02-03: 20:00:00 via INTRAVENOUS

## 2018-02-03 MED ORDER — SODIUM CHLORIDE 0.9 % IV SOLN
500.0000 mg | INTRAVENOUS | Status: DC
  Administered 2018-02-03 – 2018-02-04 (×2): 500 mg via INTRAVENOUS
  Filled 2018-02-03 (×2): qty 500

## 2018-02-03 MED ORDER — LEVOTHYROXINE SODIUM 88 MCG PO TABS
88.0000 ug | ORAL_TABLET | Freq: Every day | ORAL | Status: DC
  Filled 2018-02-03: qty 1

## 2018-02-03 MED ORDER — ACETAMINOPHEN 500 MG PO TABS
1000.0000 mg | ORAL_TABLET | Freq: Once | ORAL | Status: AC
  Administered 2018-02-03: 1000 mg via ORAL
  Filled 2018-02-03: qty 2

## 2018-02-03 MED ORDER — ACETAMINOPHEN 650 MG RE SUPP: 650.0000 mg | Freq: Four times a day (QID) | RECTAL | Status: DC | PRN

## 2018-02-03 MED ORDER — PRAVASTATIN SODIUM 20 MG PO TABS
10.0000 mg | ORAL_TABLET | Freq: Every day | ORAL | Status: DC
  Administered 2018-02-03: 10 mg via ORAL
  Filled 2018-02-03: qty 1

## 2018-02-03 MED ORDER — TRAMADOL HCL 50 MG PO TABS: 50.0000 mg | ORAL_TABLET | Freq: Four times a day (QID) | ORAL | Status: DC | PRN

## 2018-02-03 MED ORDER — ONDANSETRON HCL 4 MG PO TABS: 4.0000 mg | ORAL_TABLET | Freq: Four times a day (QID) | ORAL | Status: DC | PRN

## 2018-02-03 MED ORDER — SODIUM CHLORIDE 0.9% FLUSH
3.0000 mL | Freq: Two times a day (BID) | INTRAVENOUS | Status: DC
  Administered 2018-02-03 – 2018-02-08 (×7): 3 mL via INTRAVENOUS

## 2018-02-03 MED ORDER — TERAZOSIN HCL 5 MG PO CAPS: 10.0000 mg | ORAL_CAPSULE | Freq: Every day | ORAL | Status: DC

## 2018-02-03 MED ORDER — LATANOPROST 0.005 % OP SOLN
1.0000 [drp] | Freq: Every day | OPHTHALMIC | Status: DC
Start: 2018-02-03 — End: 2018-02-08
  Administered 2018-02-03 – 2018-02-04 (×2): 1 [drp] via OPHTHALMIC
  Filled 2018-02-03: qty 2.5

## 2018-02-03 MED ORDER — SODIUM CHLORIDE 0.9 % IV BOLUS (SEPSIS)
1000.0000 mL | Freq: Once | INTRAVENOUS | Status: AC
  Administered 2018-02-03: 1000 mL via INTRAVENOUS

## 2018-02-03 MED ORDER — POLYETHYLENE GLYCOL 3350 17 G PO PACK: 17.0000 g | PACK | Freq: Every day | ORAL | Status: DC | PRN

## 2018-02-03 MED ORDER — BRIMONIDINE TARTRATE 0.2 % OP SOLN
1.0000 [drp] | Freq: Two times a day (BID) | OPHTHALMIC | Status: DC
  Administered 2018-02-03 – 2018-02-05 (×4): 1 [drp] via OPHTHALMIC
  Filled 2018-02-03: qty 5

## 2018-02-03 MED ORDER — ONDANSETRON HCL 4 MG/2ML IJ SOLN: 4.0000 mg | Freq: Four times a day (QID) | INTRAMUSCULAR | Status: DC | PRN

## 2018-02-03 MED ORDER — ACETAMINOPHEN 325 MG PO TABS: 650.0000 mg | ORAL_TABLET | Freq: Four times a day (QID) | ORAL | Status: DC | PRN

## 2018-02-03 MED ORDER — ENOXAPARIN SODIUM 40 MG/0.4ML ~~LOC~~ SOLN
40.0000 mg | SUBCUTANEOUS | Status: DC
  Administered 2018-02-03 – 2018-02-06 (×3): 40 mg via SUBCUTANEOUS
  Filled 2018-02-03 (×2): qty 0.4

## 2018-02-03 MED ORDER — TETANUS-DIPHTH-ACELL PERTUSSIS 5-2.5-18.5 LF-MCG/0.5 IM SUSP
0.5000 mL | Freq: Once | INTRAMUSCULAR | Status: AC
  Administered 2018-02-03: 0.5 mL via INTRAMUSCULAR
  Filled 2018-02-03: qty 0.5

## 2018-02-03 MED ORDER — SODIUM CHLORIDE 0.9 % IV SOLN
2.0000 g | INTRAVENOUS | Status: DC
  Administered 2018-02-03 – 2018-02-04 (×2): 2 g via INTRAVENOUS
  Filled 2018-02-03: qty 20
  Filled 2018-02-03: qty 2

## 2018-02-03 MED ORDER — SODIUM CHLORIDE 0.9 % IV BOLUS (SEPSIS)
250.0000 mL | Freq: Once | INTRAVENOUS | Status: AC
  Administered 2018-02-03: 250 mL via INTRAVENOUS

## 2018-02-03 MED ORDER — DORZOLAMIDE HCL 2 % OP SOLN
1.0000 [drp] | Freq: Two times a day (BID) | OPHTHALMIC | Status: DC
  Administered 2018-02-03 – 2018-02-05 (×4): 1 [drp] via OPHTHALMIC
  Filled 2018-02-03: qty 10

## 2018-02-03 MED ORDER — SODIUM CHLORIDE 0.9 % IV BOLUS
500.0000 mL | Freq: Once | INTRAVENOUS | Status: AC
  Administered 2018-02-03: 500 mL via INTRAVENOUS

## 2018-02-03 MED ORDER — SODIUM CHLORIDE 0.9 % IV SOLN: 250.0000 mL | INTRAVENOUS | Status: DC | PRN

## 2018-02-03 MED ORDER — FERROUS SULFATE 325 (65 FE) MG PO TABS
325.0000 mg | ORAL_TABLET | Freq: Every day | ORAL | Status: DC
  Filled 2018-02-03: qty 1

## 2018-02-03 MED ORDER — TRAZODONE HCL 50 MG PO TABS: 25.0000 mg | ORAL_TABLET | Freq: Every evening | ORAL | Status: DC | PRN

## 2018-02-03 NOTE — ED Notes (Signed)
Bed: WA04 Expected date:  Expected time:  Means of arrival:  Comments: EMS/elderly/fever

## 2018-02-03 NOTE — ED Notes (Signed)
Delay in taking pt upstairs--administered tdap booster

## 2018-02-03 NOTE — ED Notes (Signed)
Called Briana on 4E to inform of delay giving report.

## 2018-02-03 NOTE — ED Notes (Signed)
ED TO INPATIENT HANDOFF REPORT  Name/Age/Gender Joshua Dixon 84 y.o. male  Code Status Advance Directive Documentation     Most Recent Value  Type of Advance Directive  Healthcare Power of [place]  Pre-existing out of facility DNR order (yellow form or pink MOST form)  -  "MOST" Form in Place?  -      Home/SNF/Other Home  Chief Complaint Fever  Level of Care/Admitting Diagnosis ED Disposition    ED Disposition Condition [place]: [place] [100102]  Level of Care: Telemetry [5]  Admit to tele based on following criteria: Complex arrhythmia (Bradycardia/Tachycardia)  Diagnosis: Sepsis [facility]) [[id number]]  Admitting Physician: Alma Friendly [[id number]]  Attending Physician: Alma Friendly [[id number]]  Estimated length of stay: past midnight tomorrow  Certification:: I certify this patient will need inpatient services for at least 2 midnights  PT Class (Do Not Modify): Inpatient [101]  PT Acc Code (Do Not Modify): Private [1]       Medical History Past Medical History:  Diagnosis Date  . Cancer [facility])    prostate cancer  . Hyperlipidemia   . Hypertension     Allergies No Known Allergies  IV Location/Drains/Wounds Patient Lines/Drains/Airways Status   Active Line/Drains/Airways    Name:   Placement date:   Placement time:   Site:   Days:   Peripheral IV 02/03/18 Left Antecubital   02/03/18    1333    Antecubital   less than 1   Peripheral IV 02/03/18 Right Antecubital   02/03/18    1643    Antecubital   less than 1   External Urinary Catheter   02/03/18    1656    -   less than 1          Labs/Imaging Results for orders placed or performed during the hospital encounter of 02/03/18 (from the past 48 hour(s))  Comprehensive metabolic panel     Status: Abnormal   Collection Time: 02/03/18  1:50 PM  Result Value Ref Range   Sodium 142 135 - 145 mmol/L   Potassium 3.5 3.5 - 5.1 mmol/L   Chloride 110 101 - 111  mmol/L   CO2 22 22 - 32 mmol/L   Glucose, Bld 124 (H) 65 - 99 mg/dL   BUN 15 6 - 20 mg/dL   Creatinine, Ser 1.15 0.61 - 1.24 mg/dL   Calcium 8.6 (L) 8.9 - 10.3 mg/dL   Total Protein 6.9 6.5 - 8.1 g/dL   Albumin 3.6 3.5 - 5.0 g/dL   AST 72 (H) 15 - 41 U/L   ALT 35 17 - 63 U/L   Alkaline Phosphatase 79 38 - 126 U/L   Total Bilirubin 3.0 (H) 0.3 - 1.2 mg/dL   GFR calc non Af Amer 54 (L) >60 mL/min   GFR calc Af Amer >60 >60 mL/min    Comment: (NOTE) The eGFR has been calculated using the CKD EPI equation. This calculation has not been validated in all clinical situations. eGFR's persistently <60 mL/min signify possible Chronic Kidney Disease.    Anion gap 10 5 - 15    Comment: Performed at [facility], [place] [address], [place], [place] [address]  CBC with Differential     Status: Abnormal   Collection Time: 02/03/18  1:50 PM  Result Value Ref Range   WBC 12.1 (H) 4.0 - 10.5 K/uL   RBC 3.29 (L) 4.22 - 5.81 MIL/uL   Hemoglobin 9.8 (L)  13.0 - 17.0 g/dL   HCT 30.1 (L) 39.0 - 52.0 %   MCV 91.5 78.0 - 100.0 fL   MCH 29.8 26.0 - 34.0 pg   MCHC 32.6 30.0 - 36.0 g/dL   RDW 21.0 (H) 11.5 - 15.5 %   Platelets 142 (L) 150 - 400 K/uL   Neutrophils Relative % 92 %   Lymphocytes Relative 4 %   Monocytes Relative 4 %   Eosinophils Relative 0 %   Basophils Relative 0 %   Neutro Abs 11.1 (H) 1.7 - 7.7 K/uL   Lymphs Abs 0.5 (L) 0.7 - 4.0 K/uL   Monocytes Absolute 0.5 0.1 - 1.0 K/uL   Eosinophils Absolute 0.0 0.0 - 0.7 K/uL   Basophils Absolute 0.0 0.0 - 0.1 K/uL   RBC Morphology Schistocytes present    Smear Review PLATELET COUNT CONFIRMED BY SMEAR     Comment: Performed at [facility], [place] [address], [place], [place] [address]  Protime-INR     Status: None   Collection Time: 02/03/18  1:50 PM  Result Value Ref Range   Prothrombin Time 14.7 11.4 - 15.2 seconds   INR 1.16     Comment: Performed at [facility], [place] [address], [place], [place] [address]  I-Stat CG4 Lactic Acid, ED     Status: None   Collection Time: 02/03/18  1:54 PM  Result Value Ref Range   Lactic Acid, Venous 1.53 0.5 - 1.9 mmol/L  Urinalysis, Routine w reflex microscopic     Status: None   Collection Time: 02/03/18  4:44 PM  Result Value Ref Range   Color, Urine YELLOW YELLOW   APPearance CLEAR CLEAR   Specific Gravity, Urine 1.014 1.005 - 1.030   pH 7.0 5.0 - 8.0   Glucose, UA NEGATIVE NEGATIVE mg/dL   Hgb urine dipstick NEGATIVE NEGATIVE   Bilirubin Urine NEGATIVE NEGATIVE   Ketones, ur NEGATIVE NEGATIVE mg/dL   Protein, ur NEGATIVE NEGATIVE mg/dL   Nitrite NEGATIVE NEGATIVE   Leukocytes, UA NEGATIVE NEGATIVE    Comment: Performed at [place] [address] [place], [place] [address]   Dg Chest 2 View  Result Date: 02/03/2018 CLINICAL DATA:  Slid out of bed.  Suspected urinary tract infection. EXAM: CHEST - 2 VIEW COMPARISON:  Chest radiograph May 21, 2017 FINDINGS: Cardiac silhouette is mildly enlarged and unchanged. Coronary artery calcification and/or stent. Large hiatal hernia. Calcified aortic arch. No pleural effusion or focal consolidation. LEFT lung base atelectasis/scarring. No pneumothorax. Old RIGHT and healing LEFT rib fractures. Mild old approximate L1 compression fracture. Osteopenia. IMPRESSION: 1. Stable cardiomegaly with LEFT lung base atelectasis/scarring. 2. Large hiatal hernia. 3.  Aortic Atherosclerosis (ICD10-I70.0). Electronically Signed   By: Elon Alas M.D.   On: 02/03/2018 15:26   Ct Head Wo Contrast  Result Date: 02/03/2018 CLINICAL DATA:  Altered mental status. EXAM: CT HEAD WITHOUT CONTRAST TECHNIQUE: Contiguous axial images were obtained from the base of the skull through the vertex without intravenous contrast. COMPARISON:  None FINDINGS: Brain: No evidence of acute infarction, hemorrhage, hydrocephalus, extra-axial collection or mass lesion/mass effect. Prominence of the  sulci and ventricles compatible with age related brain atrophy. There is mild diffuse low-attenuation within the subcortical and periventricular white matter compatible with chronic microvascular disease. Vascular: No hyperdense vessel or unexpected calcification. Skull: Normal. Negative for fracture or focal lesion. Sinuses/Orbits: No acute finding. Other: None. IMPRESSION: 1. No acute intracranial abnormalities. 2. Chronic small vessel ischemic change  and brain atrophy. Electronically Signed   By: Kerby Moors M.D.   On: 02/03/2018 15:50    Pending Labs Unresulted Labs (From admission, onward)   Start     Ordered   02/03/18 1701  Culture, blood (routine x 2)  BLOOD CULTURE X 2,   R     02/03/18 1700   02/03/18 1333  Urine culture  STAT,   STAT     02/03/18 1332      Vitals/Pain Today's Vitals   02/03/18 1453 02/03/18 1456 02/03/18 1607 02/03/18 1630  BP: 131/72  (!) 136/57 128/66  Pulse: 98  (!) 102 89  Resp: (!) 30  (!) 25 (!) 29  Temp:  (!) 100.8 F (38.2 C)    TempSrc:  Rectal    SpO2: 99%  97% 96%  Weight:      Height:        Isolation Precautions No active isolations  Medications Medications  sodium chloride 0.9 % bolus 1,000 mL (1,000 mLs Intravenous New Bag/Given 02/03/18 1636)    And  sodium chloride 0.9 % bolus 1,000 mL (1,000 mLs Intravenous New Bag/Given 02/03/18 1636)    And  sodium chloride 0.9 % bolus 250 mL (has no administration in time range)  cefTRIAXone (ROCEPHIN) 2 g in sodium chloride 0.9 % 100 mL IVPB (2 g Intravenous New Bag/Given 02/03/18 1647)  azithromycin (ZITHROMAX) 500 mg in sodium chloride 0.9 % 250 mL IVPB (has no administration in time range)  acetaminophen (TYLENOL) tablet 1,000 mg (has no administration in time range)  Tdap (BOOSTRIX) injection 0.5 mL (has no administration in time range)    Mobility non-ambulatory

## 2018-02-03 NOTE — ED Notes (Signed)
Pt currently in CT.

## 2018-02-03 NOTE — ED Notes (Signed)
ED PA at bedside with pt and family.

## 2018-02-03 NOTE — ED Notes (Signed)
1st set of blood cultures obtained at 1642 before first abx

## 2018-02-03 NOTE — ED Provider Notes (Signed)
[facility] DEPT Provider Note   CSN: [id number] Arrival date & time: 02/03/18  1311     History   Chief Complaint Chief Complaint  Patient presents with  . Fall  . Urinary Tract Infection    HPI Joshua Dixon is a 84 y.o. male.  HPI   Joshua Dixon is a 84 year old male with a history of arthritis, hypothyroidism, hyperlipidemia who presents to the emergency department via EMS for evaluation of cough, AMS and fall earlier today.  Level 5 caveat applies given patient's altered mental status.  Per daughter-in-law who is his power of attorney, patient has had a cough for the past couple of days which is productive and sounds wet.  Today his caregiver was trying to help him out of bed when he fell forward and rolled onto his side, cutting the anterior portion of his left shin.  No report of him hitting his head or losing consciousness.  Family also states that he is more confused than normal.  Per EMS report he had a foul odor to his urine.  Family members deny any fever.   No past medical history on file.  There are no active problems to display for this patient.   History reviewed. No pertinent surgical history.      Home Medications    Prior to Admission medications   Not on File    Family History No family history on file.  Social History Social History   Tobacco Use  . Smoking status: Not on file  Substance Use Topics  . Alcohol use: Not on file  . Drug use: Not on file     Allergies   Patient has no known allergies.   Review of Systems Review of Systems  Unable to perform ROS: Mental status change     Physical Exam Updated Vital Signs BP 128/66   Pulse 89   Temp (!) 100.8 F (38.2 C) (Rectal)   Resp (!) 29   Ht 5\' 8"  (1.727 m)   Wt 68 kg (150 lb)   SpO2 96%   BMI 22.81 kg/m   Physical Exam  Constitutional: He appears well-developed and well-nourished. No distress.  Sitting at bedside in no apparent distress,  nontoxic-appearing.  HENT:  Head: Normocephalic and atraumatic.  Mouth/Throat: Oropharynx is clear and moist. No oropharyngeal exudate.  Eyes: Pupils are equal, round, and reactive to light. Right eye exhibits no discharge. Left eye exhibits no discharge.  Right eye with ectropion.  Yellow drainage present.  Mild scleral injection.   Neck: Normal range of motion. Neck supple.  Cardiovascular: Normal rate, regular rhythm and intact distal pulses. Exam reveals no friction rub.  No murmur heard. Pulmonary/Chest: Effort normal. No respiratory distress.  No respiratory distress.  Inspiratory rhonchi auscultated in left lung base.  No crackles, stridor or wheezes.  Abdominal: Soft. Bowel sounds are normal. There is no tenderness. There is no guarding.  Musculoskeletal:  Approximately 1 cm superficial laceration on the left anterior shin.  No active bleeding.  No surrounding erythema, warmth or induration.  Nontender to palpation.  Neurological: He is alert. Coordination normal.  Patient alert, disoriented to place and time.  Oriented to person.  Moving all extremities.  Skin: Skin is warm and dry. Capillary refill takes less than 2 seconds. He is not diaphoretic.  Psychiatric: He has a normal mood and affect. His behavior is normal.  Nursing note and vitals reviewed.    ED Treatments / Results  Labs (all labs  ordered are listed, but only abnormal results are displayed) Labs Reviewed  COMPREHENSIVE METABOLIC PANEL - Abnormal; Notable for the following components:      Result Value   Glucose, Bld 124 (*)    Calcium 8.6 (*)    AST 72 (*)    Total Bilirubin 3.0 (*)    GFR calc non Af Amer 54 (*)    All other components within normal limits  CBC WITH DIFFERENTIAL/PLATELET - Abnormal; Notable for the following components:   WBC 12.1 (*)    RBC 3.29 (*)    Hemoglobin 9.8 (*)    HCT 30.1 (*)    RDW 21.0 (*)    Platelets 142 (*)    Neutro Abs 11.1 (*)    Lymphs Abs 0.5 (*)    All other  components within normal limits  URINE CULTURE  PROTIME-INR  URINALYSIS, ROUTINE W REFLEX MICROSCOPIC  I-STAT CG4 LACTIC ACID, ED  I-STAT CG4 LACTIC ACID, ED    EKG EKG Interpretation  Date/Time:  Wednesday Feb 03 2018 13:41:53 EDT Ventricular Rate:  101 PR Interval:    QRS Duration: 84 QT Interval:  377 QTC Calculation: 489 R Axis:   82 Text Interpretation:  Atrial fibrillation Borderline right axis deviation Posterior infarct, recent NAC Confirmed by Varney Biles ([id number]) on 02/03/2018 4:14:16 PM   Radiology Dg Chest 2 View  Result Date: 02/03/2018 CLINICAL DATA:  Slid out of bed.  Suspected urinary tract infection. EXAM: CHEST - 2 VIEW COMPARISON:  Chest radiograph May 21, 2017 FINDINGS: Cardiac silhouette is mildly enlarged and unchanged. Coronary artery calcification and/or stent. Large hiatal hernia. Calcified aortic arch. No pleural effusion or focal consolidation. LEFT lung base atelectasis/scarring. No pneumothorax. Old RIGHT and healing LEFT rib fractures. Mild old approximate L1 compression fracture. Osteopenia. IMPRESSION: 1. Stable cardiomegaly with LEFT lung base atelectasis/scarring. 2. Large hiatal hernia. 3.  Aortic Atherosclerosis (ICD10-I70.0). Electronically Signed   By: Elon Alas M.D.   On: 02/03/2018 15:26   Ct Head Wo Contrast  Result Date: 02/03/2018 CLINICAL DATA:  Altered mental status. EXAM: CT HEAD WITHOUT CONTRAST TECHNIQUE: Contiguous axial images were obtained from the base of the skull through the vertex without intravenous contrast. COMPARISON:  None FINDINGS: Brain: No evidence of acute infarction, hemorrhage, hydrocephalus, extra-axial collection or mass lesion/mass effect. Prominence of the sulci and ventricles compatible with age related brain atrophy. There is mild diffuse low-attenuation within the subcortical and periventricular white matter compatible with chronic microvascular disease. Vascular: No hyperdense vessel or unexpected  calcification. Skull: Normal. Negative for fracture or focal lesion. Sinuses/Orbits: No acute finding. Other: None. IMPRESSION: 1. No acute intracranial abnormalities. 2. Chronic small vessel ischemic change and brain atrophy. Electronically Signed   By: Kerby Moors M.D.   On: 02/03/2018 15:50    Procedures Procedures (including critical care time)  Medications Ordered in ED Medications  sodium chloride 0.9 % bolus 1,000 mL (1,000 mLs Intravenous New Bag/Given 02/03/18 1636)    And  sodium chloride 0.9 % bolus 1,000 mL (1,000 mLs Intravenous New Bag/Given 02/03/18 1636)    And  sodium chloride 0.9 % bolus 250 mL (has no administration in time range)  cefTRIAXone (ROCEPHIN) 2 g in sodium chloride 0.9 % 100 mL IVPB (has no administration in time range)  azithromycin (ZITHROMAX) 500 mg in sodium chloride 0.9 % 250 mL IVPB (has no administration in time range)  acetaminophen (TYLENOL) tablet 1,000 mg (has no administration in time range)  Tdap (BOOSTRIX) injection 0.5 mL (has  no administration in time range)     Initial Impression / Assessment and Plan / ED Course  I have reviewed the triage vital signs and the nursing notes.  Pertinent labs & imaging results that were available during my care of the patient were reviewed by me and considered in my medical decision making (see chart for details).     Patient will be admitted for sepsis with associated altered mental status.  He is febrile to 100.8 F and mildly tachycardic with pulse of 102.  He also has a white blood cell count of 12.1.  No lactic acidosis, but his bilirubin is elevated at 3.0.  Given elevated bilirubin, he does meet criteria for severe sepsis and blood cultures ordered prior to antibiotics.  His infection is presumably from a respiratory illness, chest x-ray reveals left lung base atelectasis/scarring, although given lung exam suspect this may be developing pneumonia.  UA yet to be collected given patient is unable to  spontaneously urinate for nursing staff.  I&O cath ordered.  Patient started on ceftriaxone and azithromycin for presumed pneumonia.  The ceftriaxone will cover urinary pathogens.  Fluid boluses starting.  CT head scan ordered given patient had a fall earlier today, no acute abnormality.  Tetanus updated given small superficial cut on his left anterior leg.  No laceration repair necessary at this time. Discussed this patient with Dr. Kathrynn Humble who also saw the patient and agrees with plan to admit for sepsis.  Discussed this patient to the Hospitalist Dr. Horris Latino who will admit the patient. Family informed.   Final Clinical Impressions(s) / ED Diagnoses   Final diagnoses:  None    ED Discharge Orders    None       Glyn Ade, PA-C 02/03/18 [place], Ankit, MD 02/03/18 1659

## 2018-02-03 NOTE — H&P (Signed)
History and Physical  Joshua Dixon XLK:[id number] DOB: Feb 26, 1926 DOA: 02/03/2018  Referring physician: EDP  PCP: Joshua Carol, MD  Outpatient Specialists:  Patient coming from: home & is able to ambulate with a walker  Chief Complaint: Fall/weakness, cough, confusion   HPI: Joshua Dixon is a 84 y.o. male with medical history significant for HLD, hypothyroidism, osteoporosis, anemia, prostate CA on leupron, BPH, glaucoma was brought by his family for evaluation of cough, AMS, generalized weakness and fall. Per son and wife, pt has had productive cough of yellowish sputum for the past couple of days. Pt has been noted to be much weaker generally, resulting in a fall PTA, no report of him hitting his head or LOC. Pt has been also more confused. Per EMS report, had a foul odor to his urine. Denies any fever, chest pain, SOB, abdominal pain, diarrhea, dysuria, N/V, headache, neck stiffness.  ED Course: CXR showed ?PNA, due to elevated bilirubin >2, code sepsis was activated due to SIRS criteria. Pt was started on IV Azithromycin + IV Rocephin. Pt admitted for further management. Once pt was sent up to the floor, got a call around 7pm with pt noted to be hypotensive s/p 2L bolus. I ordered another bolus and transferred pt to SDU for closer observation.  Review of Systems: Review of systems are otherwise negative   Past Medical History:  Diagnosis Date  . Cancer [facility])    prostate cancer  . Hyperlipidemia   . Hypertension    History reviewed. No pertinent surgical history.  Social History:  has no tobacco, alcohol, and drug history on file.   No Known Allergies  History reviewed. No pertinent family history.    Prior to Admission medications   Medication Sig Start Date End Date Taking? Authorizing Provider  brimonidine (ALPHAGAN) 0.2 % ophthalmic solution Place 1 drop into the right eye 2 (two) times daily. 01/18/18  Yes [provider]  Calcium Citrate (CITRACAL PO) Take 2 tablets by  mouth daily.   Yes [provider]  Cyanocobalamin (VITAMIN B-12 PO) Take 1 tablet by mouth daily.   Yes [provider]  denosumab (PROLIA) 60 MG/ML SOSY injection Inject 60 mg into the skin every 6 (six) months.   Yes [provider]  dorzolamide (TRUSOPT) 2 % ophthalmic solution Place 1 drop into the right eye 2 (two) times daily. 01/14/18  Yes [provider]  IRON PO Take 1 tablet by mouth daily.   Yes [provider]  latanoprost (XALATAN) 0.005 % ophthalmic solution Place 1 drop into both eyes at bedtime. 01/13/18  Yes [provider]  levothyroxine (SYNTHROID, LEVOTHROID) 88 MCG tablet Take 88 mcg by mouth daily. 01/13/18  Yes [provider]  lovastatin (MEVACOR) 10 MG tablet Take 10 mg by mouth every evening. 01/13/18  Yes [provider]  Multiple Vitamins-Minerals (CENTRUM ADULTS PO) Take 2 tablets by mouth daily.   Yes [provider]  terazosin (HYTRIN) 10 MG capsule Take 10 mg by mouth at bedtime. 01/13/18  Yes [provider]    Physical Exam: BP (!) 76/48 (BP Location: Right Arm)   Pulse 92   Temp 99.1 F (37.3 C) (Oral)   Resp 16   Ht 5\' 8"  (1.727 m)   Wt 68 kg (150 lb)   SpO2 96%   BMI 22.81 kg/m   General: NAD, mildly confused, ill-appearing, oriented to person only Eyes: Mild scleral injection  ENT: Normal appearing  Neck: Supple  Cardiovascular: S1, S2 present  Respiratory: Rhonchi noted in L lung base, no wheezing heard Abdomen: Soft, non-tender, ND, BS present  Skin: Multiple bruises noted on BLE Musculoskeletal: Laceration noted on L ant shin, no pedal edema  Psychiatric: Normal mood  Neurologic: No focal neurologic deficit          Labs on Admission:  Basic Metabolic Panel: Recent Labs  Lab 02/03/18 1350  NA 142  K 3.5  CL 110  CO2 22  GLUCOSE 124*  BUN 15  CREATININE 1.15  CALCIUM 8.6*   Liver Function Tests: Recent Labs  Lab 02/03/18 1350  AST 72*    ALT 35  ALKPHOS 79  BILITOT 3.0*  PROT 6.9  ALBUMIN 3.6   No results for input(s): LIPASE, AMYLASE in the last 168 hours. No results for input(s): AMMONIA in the last 168 hours. CBC: Recent Labs  Lab 02/03/18 1350  WBC 12.1*  NEUTROABS 11.1*  HGB 9.8*  HCT 30.1*  MCV 91.5  PLT 142*   Cardiac Enzymes: No results for input(s): CKTOTAL, CKMB, CKMBINDEX, TROPONINI in the last 168 hours.  BNP (last 3 results) No results for input(s): BNP in the last 8760 hours.  ProBNP (last 3 results) No results for input(s): PROBNP in the last 8760 hours.  CBG: No results for input(s): GLUCAP in the last 168 hours.  Radiological Exams on Admission: Dg Chest 2 View  Result Date: 02/03/2018 CLINICAL DATA:  Slid out of bed.  Suspected urinary tract infection. EXAM: CHEST - 2 VIEW COMPARISON:  Chest radiograph May 21, 2017 FINDINGS: Cardiac silhouette is mildly enlarged and unchanged. Coronary artery calcification and/or stent. Large hiatal hernia. Calcified aortic arch. No pleural effusion or focal consolidation. LEFT lung base atelectasis/scarring. No pneumothorax. Old RIGHT and healing LEFT rib fractures. Mild old approximate L1 compression fracture. Osteopenia. IMPRESSION: 1. Stable cardiomegaly with LEFT lung base atelectasis/scarring. 2. Large hiatal hernia. 3.  Aortic Atherosclerosis (ICD10-I70.0). Electronically Signed   By: Elon Alas M.D.   On: 02/03/2018 15:26   Ct Head Wo Contrast  Result Date: 02/03/2018 CLINICAL DATA:  Altered mental status. EXAM: CT HEAD WITHOUT CONTRAST TECHNIQUE: Contiguous axial images were obtained from the base of the skull through the vertex without intravenous contrast. COMPARISON:  None FINDINGS: Brain: No evidence of acute infarction, hemorrhage, hydrocephalus, extra-axial collection or mass lesion/mass effect. Prominence of the sulci and ventricles compatible with age related brain atrophy. There is mild diffuse low-attenuation within the subcortical  and periventricular white matter compatible with chronic microvascular disease. Vascular: No hyperdense vessel or unexpected calcification. Skull: Normal. Negative for fracture or focal lesion. Sinuses/Orbits: No acute finding. Other: None. IMPRESSION: 1. No acute intracranial abnormalities. 2. Chronic small vessel ischemic change and brain atrophy. Electronically Signed   By: Kerby Moors M.D.   On: 02/03/2018 15:50    EKG: Independently reviewed. ??Afib, no acute ST changes   Assessment/Plan Present on Admission: . Sepsis ([place]) . Prostate cancer ([place]) . Hypothyroidism . Glaucoma  Principal Problem:   Sepsis ([place]) Active Problems:   Prostate cancer ([place])   Hypothyroidism   Glaucoma   Hyperlipidemia   BPH (benign prostatic hyperplasia)  Septic shock likely due to CAP Vs UTI Pt initially admitted for sepsis, currently hypotensive s/p 3L of IVF Afebrile with leukocytosis Sepsis workup in progress: LA WNL, procalcitonin pending, BC X 2 pending UA negative, UC pending Urine legionella, strep pneumo pending CXR with no focal consolidation, may order a CT chest to further evaluate S/P IVF 3L, with maintenance Continue IV Azithromycin +  Rocephin Transferred pt to SDU for closer observation, advised to call rapid response/PCCM if continuously remains hypotensive  Generalized weakness with fall Likely due to above CT head: No acute abnormality Fall precautions PT/OT once stable  Acute metabolic encephalopathy with ??underlying dementia Son reports some cognitive impairments, but does note worsening confusion over the past couple of days Likely due to above Management as above  Normocytic anemia Hgb 9.8, no baseline to compare Iron panel pending Continue oral supplement  Hypothyroidism TSH in am  Continue synthroid  Hyperlipidemia Continue statins  Glaucoma  Continue home eye drops  Prostate Ca On lupron every 6 months      DVT prophylaxis: Lovenox  Code  Status: Full   Family Communication: Wife and son at bedside  Disposition Plan: Once stable   Consults called: None  Admission status: Inpatient, SDU     Alma Friendly MD [facility]   If 7PM-7AM, please contact night-coverage [email or web address] Password [facility]  02/03/2018, 9:14 PM

## 2018-02-03 NOTE — ED Triage Notes (Signed)
Pt comes from home where pt lives with family. Pt brought in via [facility]. Pt is here for sliding out of bed (not falling). EMS showed up to assist with pt and family stated that pt has become  more altered mental status. Possible UTI due to strong smell of urine.

## 2018-02-04 ENCOUNTER — Encounter (HOSPITAL_COMMUNITY): Payer: Self-pay | Admitting: *Deleted

## 2018-02-04 ENCOUNTER — Inpatient Hospital Stay (HOSPITAL_COMMUNITY): Payer: Medicare HMO

## 2018-02-04 DIAGNOSIS — R0600 Dyspnea, unspecified: Secondary | ICD-10-CM

## 2018-02-04 DIAGNOSIS — Z7189 Other specified counseling: Secondary | ICD-10-CM

## 2018-02-04 DIAGNOSIS — R748 Abnormal levels of other serum enzymes: Secondary | ICD-10-CM

## 2018-02-04 DIAGNOSIS — J9621 Acute and chronic respiratory failure with hypoxia: Secondary | ICD-10-CM

## 2018-02-04 DIAGNOSIS — A419 Sepsis, unspecified organism: Principal | ICD-10-CM

## 2018-02-04 DIAGNOSIS — Z515 Encounter for palliative care: Secondary | ICD-10-CM

## 2018-02-04 LAB — CBC
HEMATOCRIT: 29.6 % — AB (ref 39.0–52.0)
HEMOGLOBIN: 9.3 g/dL — AB (ref 13.0–17.0)
MCH: 29.7 pg (ref 26.0–34.0)
MCHC: 31.4 g/dL (ref 30.0–36.0)
MCV: 94.6 fL (ref 78.0–100.0)
Platelets: 164 10*3/uL (ref 150–400)
RBC: 3.13 MIL/uL — ABNORMAL LOW (ref 4.22–5.81)
RDW: 21.1 % — ABNORMAL HIGH (ref 11.5–15.5)
WBC: 11.4 10*3/uL — AB (ref 4.0–10.5)

## 2018-02-04 LAB — COMPREHENSIVE METABOLIC PANEL
ALBUMIN: 3.1 g/dL — AB (ref 3.5–5.0)
ALK PHOS: 70 U/L (ref 38–126)
ALT: 83 U/L — ABNORMAL HIGH (ref 17–63)
ANION GAP: 9 (ref 5–15)
AST: 195 U/L — ABNORMAL HIGH (ref 15–41)
BILIRUBIN TOTAL: 2.3 mg/dL — AB (ref 0.3–1.2)
BUN: 19 mg/dL (ref 6–20)
CALCIUM: 7.3 mg/dL — AB (ref 8.9–10.3)
CO2: 19 mmol/L — ABNORMAL LOW (ref 22–32)
Chloride: 117 mmol/L — ABNORMAL HIGH (ref 101–111)
Creatinine, Ser: 1.19 mg/dL (ref 0.61–1.24)
GFR calc Af Amer: >60 mL/min (ref >60)
GFR calc non Af Amer: 52 mL/min — ABNORMAL LOW (ref >60)
Glucose, Bld: 151 mg/dL — ABNORMAL HIGH (ref 65–99)
POTASSIUM: 3.7 mmol/L (ref 3.5–5.1)
Sodium: 145 mmol/L (ref 135–145)
TOTAL PROTEIN: 6 g/dL — AB (ref 6.5–8.1)

## 2018-02-04 LAB — LIPID PANEL
CHOLESTEROL: 99 mg/dL (ref 0–200)
HDL: 55 mg/dL (ref >40)
LDL Cholesterol: 36 mg/dL (ref 0–99)
Total CHOL/HDL Ratio: 1.8 RATIO
Triglycerides: 40 mg/dL (ref <150)
VLDL: 8 mg/dL (ref 0–40)

## 2018-02-04 LAB — PROCALCITONIN: PROCALCITONIN: 0.97 ng/mL

## 2018-02-04 LAB — HEMOGLOBIN A1C
HEMOGLOBIN A1C: 4.7 % — AB (ref 4.8–5.6)
MEAN PLASMA GLUCOSE: 88.19 mg/dL

## 2018-02-04 LAB — GLUCOSE, CAPILLARY
Glucose-Capillary: 108 mg/dL — ABNORMAL HIGH (ref 65–99)
Glucose-Capillary: 93 mg/dL (ref 65–99)

## 2018-02-04 LAB — IRON AND TIBC
Iron: 29 ug/dL — ABNORMAL LOW (ref 45–182)
Saturation Ratios: 17 % — ABNORMAL LOW (ref 17.9–39.5)
TIBC: 172 ug/dL — AB (ref 250–450)
UIBC: 143 ug/dL

## 2018-02-04 LAB — STREP PNEUMONIAE URINARY ANTIGEN: STREP PNEUMO URINARY ANTIGEN: NEGATIVE

## 2018-02-04 LAB — FERRITIN: Ferritin: 260 ng/mL (ref 24–336)

## 2018-02-04 LAB — TSH: TSH: 0.98 u[IU]/mL (ref 0.350–4.500)

## 2018-02-04 MED ORDER — FUROSEMIDE 10 MG/ML IJ SOLN: 20.0000 mg | Freq: Once | INTRAMUSCULAR | Status: DC

## 2018-02-04 MED ORDER — IPRATROPIUM-ALBUTEROL 0.5-2.5 (3) MG/3ML IN SOLN
3.0000 mL | Freq: Three times a day (TID) | RESPIRATORY_TRACT | Status: DC
  Administered 2018-02-04: 3 mL via RESPIRATORY_TRACT
  Filled 2018-02-04: qty 3

## 2018-02-04 MED ORDER — FUROSEMIDE 10 MG/ML IJ SOLN
40.0000 mg | Freq: Once | INTRAMUSCULAR | Status: AC
  Administered 2018-02-04: 40 mg via INTRAVENOUS
  Filled 2018-02-04: qty 4

## 2018-02-04 MED ORDER — FUROSEMIDE 10 MG/ML IJ SOLN
20.0000 mg | INTRAMUSCULAR | Status: AC
  Administered 2018-02-04: 20 mg via INTRAVENOUS
  Filled 2018-02-04: qty 2

## 2018-02-04 MED ORDER — LORAZEPAM 2 MG/ML IJ SOLN
0.2500 mg | Freq: Once | INTRAMUSCULAR | Status: AC
  Administered 2018-02-04: 0.5 mg via INTRAVENOUS
  Filled 2018-02-04: qty 1

## 2018-02-04 MED ORDER — FUROSEMIDE 10 MG/ML IJ SOLN
40.0000 mg | Freq: Two times a day (BID) | INTRAMUSCULAR | Status: DC
  Administered 2018-02-04: 40 mg via INTRAVENOUS
  Filled 2018-02-04: qty 4

## 2018-02-04 MED ORDER — IPRATROPIUM-ALBUTEROL 0.5-2.5 (3) MG/3ML IN SOLN
3.0000 mL | Freq: Four times a day (QID) | RESPIRATORY_TRACT | Status: DC
  Administered 2018-02-04: 3 mL via RESPIRATORY_TRACT
  Filled 2018-02-04: qty 3

## 2018-02-04 MED ORDER — LORAZEPAM 2 MG/ML IJ SOLN
0.2500 mg | Freq: Four times a day (QID) | INTRAMUSCULAR | Status: DC | PRN
  Administered 2018-02-04 – 2018-02-05 (×2): 0.25 mg via INTRAVENOUS
  Filled 2018-02-04 (×2): qty 1

## 2018-02-04 NOTE — Progress Notes (Signed)
Per family request, put patient back on BIpap due to WOB. Patient did not tolerate Bipap well. Said he didn't want and kept trying to rip off mask. Family saw this over the course of 5 minutes and how Bipap made patient more agitated and stated that is not what they want for loved one. They no longer want patient to be put on Bipap or intubated. MD aware.

## 2018-02-04 NOTE — Progress Notes (Signed)
Discharge orders placed in error. Fuller Plan, MD.

## 2018-02-04 NOTE — Consult Note (Signed)
Consultation Note Date: 02/04/2018   Patient Name: Joshua Dixon  DOB: Dec 31, 1925  MRN: [id number]  Age / Sex: 84 y.o., male  PCP: Joshua Carol, MD Referring Physician: Alma Friendly, MD  Reason for Consultation: Establishing goals of care  HPI/Patient Profile: 84 y.o. male admitted on 02/03/2018    Clinical Assessment and Goals of Care: 84 year old gentleman who lives at home with his wife and family. He has past medical history significant for dyslipidemia hypothyroidism osteoporosis ischemia prostate cancer, glaucoma. His baseline is is such that he has limited mobility for years now. He is mostly bed bound due to degenerative joint disease and depression according to his family members. At the most, he is able to use his walker to go to the restroom.  Patient has been admitted to the hospital with cough and weakness and possible fall. He has been admitted with working diagnosis of sepsis likely secondary to community-acquired pneumonia versus urinary tract infection. He is placed in stepdown unit and has been given IV fluid resuscitation as well as antibiotics. Blood cultures are pending. Urine cultures are pending.  Most recent chest x-ray shows marked worsening bilateral airspace opacities, patient is now on Lasix.  Patient briefly was on BiPAP, however became more restless and uncomfortable and requested BiPAP to be discontinued. Family has discussed amongst themselves and has elected DO NOT INTUBATE and no BiPAP. Patient remains a partial code.  Palliative medicine consult for ongoing goals of care discussions.  Patient is resting in bed. He appears to have contractures. At times he is confused and unable to fully participate in discussions. I met with the patient's wife and daughter-in-law Joshua Dixon at the bedside. CT scan of the abdomen and pelvis is pending.  Goals wishes and values attempted to be  elicited. All of the patient's family's concerns discussed to the best of my ability.  See listed recommendations below. Thank you for the consult.  HCPOA  daughter in law Joshua Dixon is designated Psychiatrist.  Patient has been married to his wife, who is his primary care giver for the past 60 years.   SUMMARY OF RECOMMENDATIONS   Partial code Continue current mode of care for now.  Monitor hospital course and disease trajectory to help guide further discussions.   Code Status/Advance Care Planning:  Limited code    Symptom Management:   As above   Continue current mode of care.   Palliative Prophylaxis:   Delirium Protocol Psycho-social/Spiritual:   Desire for further Chaplaincy support:no, patient's Joshua Dixon is visiting.   Additional Recommendations: Caregiving  Support/Resources  Prognosis:   Guarded   Discharge Planning: To Be Determined      Primary Diagnoses: Present on Admission: . Sepsis ([place]) . Prostate cancer ([place]) . Hypothyroidism . Glaucoma   I have reviewed the medical record, interviewed the patient and family, and examined the patient. The following aspects are pertinent.  Past Medical History:  Diagnosis Date  . Cancer [facility])    prostate cancer  . Hyperlipidemia   . Hypertension  Social History   Socioeconomic History  . Marital status: Married    Spouse name: Not on file  . Number of children: Not on file  . Years of education: Not on file  . Highest education level: Not on file  Occupational History  . Not on file  Social Needs  . Financial resource strain: Not on file  . Food insecurity:    Worry: Not on file    Inability: Not on file  . Transportation needs:    Medical: Not on file    Non-medical: Not on file  Tobacco Use  . Smoking status: Not on file  Substance and Sexual Activity  . Alcohol use: Not on file  . Drug use: Not on file  . Sexual activity: Not Currently  Lifestyle  . Physical activity:    Days per week:  Not on file    Minutes per session: Not on file  . Stress: Not on file  Relationships  . Social connections:    Talks on phone: Not on file    Gets together: Not on file    Attends religious service: Not on file    Active member of club or organization: Not on file    Attends meetings of clubs or organizations: Not on file    Relationship status: Not on file  Other Topics Concern  . Not on file  Social History Narrative  . Not on file   History reviewed. No pertinent family history. Scheduled Meds: . brimonidine  1 drop Right Eye BID  . dorzolamide  1 drop Right Eye BID  . enoxaparin (LOVENOX) injection  40 mg Subcutaneous Q24H  . ferrous sulfate  325 mg Oral Q breakfast  . furosemide  40 mg Intravenous BID  . ipratropium-albuterol  3 mL Nebulization TID  . latanoprost  1 drop Both Eyes QHS  . levothyroxine  88 mcg Oral QAC breakfast  . sodium chloride flush  3 mL Intravenous Q12H   Continuous Infusions: . sodium chloride    . azithromycin Stopped (02/03/18 1939)  . cefTRIAXone (ROCEPHIN)  IV Stopped (02/03/18 1736)   PRN Meds:.sodium chloride, albuterol, LORazepam, ondansetron **OR** ondansetron (ZOFRAN) IV, polyethylene glycol, sodium chloride flush, traMADol, traZODone Medications Prior to Admission:  Prior to Admission medications   Medication Sig Start Date End Date Taking? Authorizing Provider  brimonidine (ALPHAGAN) 0.2 % ophthalmic solution Place 1 drop into the right eye 2 (two) times daily. 01/18/18  Yes [provider]  Calcium Citrate (CITRACAL PO) Take 2 tablets by mouth daily.   Yes [provider]  Cyanocobalamin (VITAMIN B-12 PO) Take 1 tablet by mouth daily.   Yes [provider]  denosumab (PROLIA) 60 MG/ML SOSY injection Inject 60 mg into the skin every 6 (six) months.   Yes [provider]  dorzolamide (TRUSOPT) 2 % ophthalmic solution Place 1 drop into the right eye 2 (two) times daily. 01/14/18  Yes [provider]  IRON PO Take 1 tablet by mouth daily.   Yes [provider]  latanoprost (XALATAN) 0.005 % ophthalmic solution Place 1 drop into both eyes at bedtime. 01/13/18  Yes [provider]  levothyroxine (SYNTHROID, LEVOTHROID) 88 MCG tablet Take 88 mcg by mouth daily. 01/13/18  Yes [provider]  lovastatin (MEVACOR) 10 MG tablet Take 10 mg by mouth every evening. 01/13/18  Yes [provider]  Multiple Vitamins-Minerals (CENTRUM ADULTS PO) Take 2 tablets by mouth daily.   Yes [provider]  terazosin (  HYTRIN) 10 MG capsule Take 10 mg by mouth at bedtime. 01/13/18  Yes [provider]   No Known Allergies Review of Systems Mostly non verbal  Physical Exam Moderate distress Appears tachypneic, resp rate in 40s S1 S2 Abdomen soft Bilateral crackles No edema  Vital Signs: BP (!) 107/51   Pulse 93   Temp 99.4 F (37.4 C) (Oral)   Resp (!) 32   Ht 5' 8"  (1.727 m)   Wt 68 kg (150 lb)   SpO2 100%   BMI 22.81 kg/m  Pain Scale: 0-10 POSS *See Group Information*: 2-Acceptable,Slightly drowsy, easily aroused Pain Score: 7   PPS 20-30% SpO2: SpO2: 100 % O2 Device:SpO2: 100 % O2 Flow Rate: .O2 Flow Rate (L/min): 5 L/min  IO: Intake/output summary:   Intake/Output Summary (Last 24 hours) at 02/04/2018 1433 Last data filed at 02/04/2018 0500 Gross per 24 hour  Intake 1150 ml  Output -  Net 1150 ml    LBM: Last BM Date: 02/03/18 Baseline Weight: Weight: 68 kg (150 lb) Most recent weight: Weight: 68 kg (150 lb)     Palliative Assessment/Data:   PPS 30%  Time In: 1300 Time Out:  1400 Time Total:  60 min  Greater than 50%  of this time was spent counseling and coordinating care related to the above assessment and plan.  Signed by: Loistine Chance, MD  [phone] Please contact Palliative Medicine Team phone at [phone] for questions and concerns.  For individual provider: See Shea Evans

## 2018-02-04 NOTE — Progress Notes (Signed)
2330 Patient agitated attempting to get out bed to have a bowel movement, assisted to [facility] at this time, patient tolerated well.  0030 Patient trying to get out bed, O2 sat's at 88-90 on 6 liters, Noted breath sounds with coarse crackles in bases, and rhonchi in upper lobes, Respirations at 35-44  Notified Dr. Tamala Julian.  0138- Patient on non- re breather at this d/t agitation and attempting to get out bed, notified Dr. Tamala Julian new orders for lasix, CXR.   0228  Order for BI-pap and one time dose of ativan.   0500 Patient resting this time,with Bi-pap in place,  Breathing 35-44 times a minute.

## 2018-02-04 NOTE — Progress Notes (Signed)
PROGRESS NOTE  Joshua Dixon BTD:[id number] DOB: [birth year] DOA: 02/03/2018 PCP: Seward Carol, MD  HPI/Recap of past 24 hours: Joshua Dixon is a 84 y.o. male with medical history significant for HLD, hypothyroidism, osteoporosis, anemia, prostate CA on leupron, BPH, glaucoma was brought by his family for evaluation of cough, AMS, generalized weakness and fall. Per son and wife, pt has had productive cough of yellowish sputum for the past couple of days. Pt has been noted to be much weaker generally, resulting in a fall PTA, no report of him hitting his head or LOC. Pt has been also more confused. Per EMS report, had a foul odor to his urine. In the ED, CXR showed ?PNA, due to elevated bilirubin >2, code sepsis was activated due to SIRS criteria. Pt admitted for further management.  Overnight, pt noted to be in acute hypoxic respiratory failure after about 3.5L of IVF given for septic shock/hypotension with SBP in 70s. Pt remains afebrile, tachypneic, with improved BP.   Assessment/Plan: Principal Problem:   Sepsis ([place]) Active Problems:   Prostate cancer (HCC)   Hypothyroidism   Glaucoma   Hyperlipidemia   BPH (benign prostatic hyperplasia)  Acute hypoxic respiratory failure Likely due to vol overload 2/2 IVF resuscitation for hypotension/shock Unable to tolerate bipap, switch to O2 [place], maintaining sats, but increased WOB CXR: Pulmonary edema, will repeat in am Continue IV lasix 40 BID as BP has improved [place] Discussed with family, no intubation or bipap Palliative team consulted    Septic shock likely due to ?CAP BP improved Pt initially admitted for sepsis, hypotensive s/p 3L of IVF Afebrile with leukocytosis LA WNL, Procalcitonin trending up 0.3-->0.97, will trend Influenza negative BC X 2 pending Urine legionella pending, strep pneumo negative CXR with no focal consolidation, repeat with pulm edema Continue IV Azithromycin + Rocephin, may escalate pending on clinical  status  Elevated liver enzymes Noted on presentation, worsening (?hypotensive episode) STAT RUQ USS, unstable for CT abd D/c tylenol, statins Trend LFTs  Generalized weakness with fall Likely due to above CT head: No acute abnormality Fall precautions PT/OT once stable  Acute metabolic encephalopathy with ??underlying dementia Son reports some cognitive impairments, but does note worsening confusion over the past couple of days Likely due to above Management as above  Normocytic anemia Hgb 9.8, no baseline to compare Iron panel: iron 29, sats 17, ferritin 260 Continue oral supplement  Hypothyroidism TSH WNL Continue synthroid  Hyperlipidemia D/C statins on discharge, LDL at goal, elderly, elevated LFTs  Glaucoma  Continue home eye drops  Prostate Ca On lupron every 6 months  Goals of care Spoke to family as well as POA who is the daughter-in-law about patient.  They agreed for partial code with no BiPAP or intubation, but all other measures should be done.  Palliative team consulted    Code Status: Partial code  Family Communication: Spoke with daughter-in-law who is the POA, wife  Disposition Plan: Once stable   Consultants:  None  Procedures:  None  Antimicrobials:  IV Rocephin  IV azithromycin  DVT prophylaxis: Lovenox   Objective: Vitals:   02/04/18 0747 02/04/18 0800 02/04/18 1000 02/04/18 1200  BP:  128/77 (!) 145/84   Pulse:  99 91   Resp:  (!) 42 (!) 36   Temp: 98.7 F (37.1 C)   99.4 F (37.4 C)  TempSrc: Oral   Oral  SpO2:  100% 98%   Weight:      Height:  Intake/Output Summary (Last 24 hours) at 02/04/2018 1325 Last data filed at 02/04/2018 0500 Gross per 24 hour  Intake 1150 ml  Output -  Net 1150 ml   Filed Weights   02/03/18 1329  Weight: 68 kg (150 lb)    Exam:   General: In moderate distress, tachypneic  Cardiovascular: S1, S2 present  Respiratory: Tachypnea, bilateral crackles  heard  Abdomen: Soft, nontender, nondistended, bowel sounds present  Musculoskeletal: No pedal edema bilaterally, laceration noted on the left anterior shin  Skin: Multiple bruises noted on BLE  Psychiatry: In distress   Data Reviewed: CBC: Recent Labs  Lab 02/03/18 1350 02/04/18 0324  WBC 12.1* 11.4*  NEUTROABS 11.1*  --   HGB 9.8* 9.3*  HCT 30.1* 29.6*  MCV 91.5 94.6  PLT 142* 062   Basic Metabolic Panel: Recent Labs  Lab 02/03/18 1350 02/04/18 0324  NA 142 145  K 3.5 3.7  CL 110 117*  CO2 22 19*  GLUCOSE 124* 151*  BUN 15 19  CREATININE 1.15 1.19  CALCIUM 8.6* 7.3*   GFR: Estimated Creatinine Clearance: 39.7 mL/min (by C-G formula based on SCr of 1.19 mg/dL). Liver Function Tests: Recent Labs  Lab 02/03/18 1350 02/04/18 0324  AST 72* 195*  ALT 35 83*  ALKPHOS 79 70  BILITOT 3.0* 2.3*  PROT 6.9 6.0*  ALBUMIN 3.6 3.1*   No results for input(s): LIPASE, AMYLASE in the last 168 hours. No results for input(s): AMMONIA in the last 168 hours. Coagulation Profile: Recent Labs  Lab 02/03/18 1350  INR 1.16   Cardiac Enzymes: No results for input(s): CKTOTAL, CKMB, CKMBINDEX, TROPONINI in the last 168 hours. BNP (last 3 results) No results for input(s): PROBNP in the last 8760 hours. HbA1C: Recent Labs    02/04/18 0324  HGBA1C 4.7*   CBG: Recent Labs  Lab 02/04/18 0716  GLUCAP 108*   Lipid Profile: Recent Labs    02/04/18 0324  CHOL 99  HDL 55  LDLCALC 36  TRIG 40  CHOLHDL 1.8   Thyroid Function Tests: Recent Labs    02/04/18 0324  TSH 0.980   Anemia Panel: Recent Labs    02/04/18 0324  FERRITIN 260  TIBC 172*  IRON 29*   Urine analysis:    Component Value Date/Time   COLORURINE YELLOW 02/03/2018 [place] 02/03/2018 1644   LABSPEC 1.014 02/03/2018 1644   PHURINE 7.0 02/03/2018 1644   GLUCOSEU NEGATIVE 02/03/2018 1644   HGBUR NEGATIVE 02/03/2018 [place] 02/03/2018 [place] 02/03/2018 [place] 02/03/2018 1644   NITRITE NEGATIVE 02/03/2018 1644   LEUKOCYTESUR NEGATIVE 02/03/2018 1644   Sepsis Labs: @LABRCNTIP (procalcitonin:4,lacticidven:4)  ) Recent Results (from the past 240 hour(s))  MRSA PCR Screening     Status: None   Collection Time: 02/03/18  8:41 PM  Result Value Ref Range Status   MRSA by PCR NEGATIVE NEGATIVE Final    Comment:        The GeneXpert MRSA Assay (FDA approved for NASAL specimens only), is one component of a comprehensive MRSA colonization surveillance program. It is not intended to diagnose MRSA infection nor to guide or monitor treatment for MRSA infections. Performed at [facility], [place] [address] [place], [place] [address]       Studies: Dg Chest 2 View  Result Date: 02/03/2018 CLINICAL DATA:  Slid out of bed.  Suspected urinary tract infection. EXAM: CHEST - 2 VIEW COMPARISON:  Chest radiograph May 21, 2017 FINDINGS: Cardiac silhouette is mildly enlarged and unchanged. Coronary artery calcification and/or stent. Large hiatal hernia. Calcified aortic arch. No pleural effusion or focal consolidation. LEFT lung base atelectasis/scarring. No pneumothorax. Old RIGHT and healing LEFT rib fractures. Mild old approximate L1 compression fracture. Osteopenia. IMPRESSION: 1. Stable cardiomegaly with LEFT lung base atelectasis/scarring. 2. Large hiatal hernia. 3.  Aortic Atherosclerosis (ICD10-I70.0). Electronically Signed   By: Elon Alas M.D.   On: 02/03/2018 15:26   Ct Head Wo Contrast  Result Date: 02/03/2018 CLINICAL DATA:  Altered mental status. EXAM: CT HEAD WITHOUT CONTRAST TECHNIQUE: Contiguous axial images were obtained from the base of the skull through the vertex without intravenous contrast. COMPARISON:  None FINDINGS: Brain: No evidence of acute infarction, hemorrhage, hydrocephalus, extra-axial collection or mass lesion/mass effect. Prominence of the sulci and  ventricles compatible with age related brain atrophy. There is mild diffuse low-attenuation within the subcortical and periventricular white matter compatible with chronic microvascular disease. Vascular: No hyperdense vessel or unexpected calcification. Skull: Normal. Negative for fracture or focal lesion. Sinuses/Orbits: No acute finding. Other: None. IMPRESSION: 1. No acute intracranial abnormalities. 2. Chronic small vessel ischemic change and brain atrophy. Electronically Signed   By: Kerby Moors M.D.   On: 02/03/2018 15:50   Dg Chest Port 1 View  Result Date: 02/04/2018 CLINICAL DATA:  Respiratory failure EXAM: PORTABLE CHEST 1 VIEW COMPARISON:  Chest radiograph 02/03/2018 FINDINGS: Marked increase of bilateral airspace opacities, likely indicating pulmonary edema. No pneumothorax. Assessment for pleural effusions limited by positioning, but I suspect there are small bilateral pleural effusions. Hiatal hernia. Unchanged mediastinal silhouette. IMPRESSION: Marked worsening of bilateral airspace opacities. Given the short time interval since the prior study, pulmonary edema is favored. Electronically Signed   By: Ulyses Jarred M.D.   On: 02/04/2018 02:06    Scheduled Meds: . brimonidine  1 drop Right Eye BID  . dorzolamide  1 drop Right Eye BID  . enoxaparin (LOVENOX) injection  40 mg Subcutaneous Q24H  . ferrous sulfate  325 mg Oral Q breakfast  . latanoprost  1 drop Both Eyes QHS  . levothyroxine  88 mcg Oral QAC breakfast  . pravastatin  10 mg Oral q1800  . sodium chloride flush  3 mL Intravenous Q12H    Continuous Infusions: . sodium chloride    . azithromycin Stopped (02/03/18 1939)  . cefTRIAXone (ROCEPHIN)  IV Stopped (02/03/18 1736)     LOS: 1 day     Alma Friendly, MD [facility]   If 7PM-7AM, please contact night-coverage [email or web address] Password [email or web address] 02/04/2018, 1:25 PM

## 2018-02-04 NOTE — Discharge Instructions (Signed)
Transfer to [facility]

## 2018-02-04 NOTE — Progress Notes (Signed)
Pt not on BIPAP at this time. BIPAP was removed by RN.

## 2018-02-05 ENCOUNTER — Inpatient Hospital Stay (HOSPITAL_COMMUNITY): Payer: Medicare HMO

## 2018-02-05 DIAGNOSIS — R0603 Acute respiratory distress: Secondary | ICD-10-CM

## 2018-02-05 LAB — CBC WITH DIFFERENTIAL/PLATELET
Basophils Absolute: 0 10*3/uL (ref 0.0–0.1)
Basophils Relative: 0 %
EOS ABS: 0 10*3/uL (ref 0.0–0.7)
Eosinophils Relative: 0 %
HCT: 28.5 % — ABNORMAL LOW (ref 39.0–52.0)
Hemoglobin: 9.2 g/dL — ABNORMAL LOW (ref 13.0–17.0)
LYMPHS ABS: 0.8 10*3/uL (ref 0.7–4.0)
Lymphocytes Relative: 10 %
MCH: 29.9 pg (ref 26.0–34.0)
MCHC: 32.3 g/dL (ref 30.0–36.0)
MCV: 92.5 fL (ref 78.0–100.0)
Monocytes Absolute: 0.3 10*3/uL (ref 0.1–1.0)
Monocytes Relative: 3 %
NEUTROS ABS: 7.3 10*3/uL (ref 1.7–7.7)
Neutrophils Relative %: 87 %
Platelets: 120 10*3/uL — ABNORMAL LOW (ref 150–400)
RBC: 3.08 MIL/uL — ABNORMAL LOW (ref 4.22–5.81)
RDW: 21.3 % — AB (ref 11.5–15.5)
WBC: 8.4 10*3/uL (ref 4.0–10.5)

## 2018-02-05 LAB — LEGIONELLA PNEUMOPHILA SEROGP 1 UR AG: L. pneumophila Serogp 1 Ur Ag: NEGATIVE

## 2018-02-05 LAB — COMPREHENSIVE METABOLIC PANEL
ALBUMIN: 2.8 g/dL — AB (ref 3.5–5.0)
ALK PHOS: 55 U/L (ref 38–126)
ALT: 67 U/L — AB (ref 17–63)
AST: 189 U/L — AB (ref 15–41)
Anion gap: 10 (ref 5–15)
BILIRUBIN TOTAL: 1.5 mg/dL — AB (ref 0.3–1.2)
BUN: 27 mg/dL — AB (ref 6–20)
CALCIUM: 7 mg/dL — AB (ref 8.9–10.3)
CO2: 19 mmol/L — ABNORMAL LOW (ref 22–32)
CREATININE: 1.48 mg/dL — AB (ref 0.61–1.24)
Chloride: 116 mmol/L — ABNORMAL HIGH (ref 101–111)
GFR calc Af Amer: 46 mL/min — ABNORMAL LOW (ref >60)
GFR calc non Af Amer: 40 mL/min — ABNORMAL LOW (ref >60)
GLUCOSE: 88 mg/dL (ref 65–99)
Potassium: 2.9 mmol/L — ABNORMAL LOW (ref 3.5–5.1)
Sodium: 145 mmol/L (ref 135–145)
Total Protein: 5.6 g/dL — ABNORMAL LOW (ref 6.5–8.1)

## 2018-02-05 LAB — URINE CULTURE: CULTURE: NO GROWTH

## 2018-02-05 LAB — PROCALCITONIN: Procalcitonin: 7.34 ng/mL

## 2018-02-05 MED ORDER — POTASSIUM CHLORIDE 10 MEQ/100ML IV SOLN
10.0000 meq | INTRAVENOUS | Status: AC
  Administered 2018-02-05 (×2): 10 meq via INTRAVENOUS
  Filled 2018-02-05 (×2): qty 100

## 2018-02-05 MED ORDER — ONDANSETRON 4 MG PO TBDP: 4.0000 mg | ORAL_TABLET | Freq: Four times a day (QID) | ORAL | Status: DC | PRN

## 2018-02-05 MED ORDER — LORAZEPAM 2 MG/ML PO CONC
1.0000 mg | ORAL | Status: DC | PRN
  Administered 2018-02-08: 1 mg via SUBLINGUAL
  Filled 2018-02-05: qty 1

## 2018-02-05 MED ORDER — ONDANSETRON HCL 4 MG/2ML IJ SOLN: 4.0000 mg | Freq: Four times a day (QID) | INTRAMUSCULAR | Status: DC | PRN

## 2018-02-05 MED ORDER — VANCOMYCIN HCL 10 G IV SOLR
1250.0000 mg | Freq: Once | INTRAVENOUS | Status: DC
  Filled 2018-02-05: qty 1250

## 2018-02-05 MED ORDER — HALOPERIDOL LACTATE 2 MG/ML PO CONC
0.5000 mg | ORAL | Status: DC | PRN
  Filled 2018-02-05: qty 0.3

## 2018-02-05 MED ORDER — BIOTENE DRY MOUTH MT LIQD: 15.0000 mL | OROMUCOSAL | Status: DC | PRN

## 2018-02-05 MED ORDER — HYDROMORPHONE HCL 1 MG/ML IJ SOLN
0.5000 mg | INTRAMUSCULAR | Status: DC | PRN
  Administered 2018-02-05 – 2018-02-07 (×10): 0.5 mg via INTRAVENOUS
  Filled 2018-02-05: qty 0.5
  Filled 2018-02-05: qty 1
  Filled 2018-02-05 (×6): qty 0.5
  Filled 2018-02-05 (×2): qty 1

## 2018-02-05 MED ORDER — ACETAMINOPHEN 325 MG PO TABS: 650.0000 mg | ORAL_TABLET | Freq: Four times a day (QID) | ORAL | Status: DC | PRN

## 2018-02-05 MED ORDER — POLYVINYL ALCOHOL 1.4 % OP SOLN: 1.0000 [drp] | Freq: Four times a day (QID) | OPHTHALMIC | Status: DC | PRN

## 2018-02-05 MED ORDER — LORAZEPAM 1 MG PO TABS: 1.0000 mg | ORAL_TABLET | ORAL | Status: DC | PRN

## 2018-02-05 MED ORDER — GLYCOPYRROLATE 0.2 MG/ML IJ SOLN
0.2000 mg | INTRAMUSCULAR | Status: DC | PRN
  Filled 2018-02-05: qty 1

## 2018-02-05 MED ORDER — LORAZEPAM 2 MG/ML IJ SOLN
1.0000 mg | INTRAMUSCULAR | Status: DC | PRN
  Administered 2018-02-07 – 2018-02-08 (×3): 1 mg via INTRAVENOUS
  Filled 2018-02-05 (×3): qty 1

## 2018-02-05 MED ORDER — ACETAMINOPHEN 650 MG RE SUPP: 650.0000 mg | Freq: Four times a day (QID) | RECTAL | Status: DC | PRN

## 2018-02-05 MED ORDER — PIPERACILLIN-TAZOBACTAM 3.375 G IVPB
3.3750 g | Freq: Three times a day (TID) | INTRAVENOUS | Status: DC
  Administered 2018-02-06: 3.375 g via INTRAVENOUS
  Filled 2018-02-05: qty 50

## 2018-02-05 MED ORDER — PIPERACILLIN-TAZOBACTAM 3.375 G IVPB 30 MIN
3.3750 g | Freq: Once | INTRAVENOUS | Status: AC
  Administered 2018-02-05: 3.375 g via INTRAVENOUS
  Filled 2018-02-05: qty 50

## 2018-02-05 MED ORDER — LORAZEPAM 2 MG/ML IJ SOLN
0.5000 mg | Freq: Once | INTRAMUSCULAR | Status: AC
  Administered 2018-02-05: 0.5 mg via INTRAVENOUS
  Filled 2018-02-05: qty 1

## 2018-02-05 MED ORDER — HALOPERIDOL LACTATE 5 MG/ML IJ SOLN
0.5000 mg | INTRAMUSCULAR | Status: DC | PRN
  Administered 2018-02-05 – 2018-02-06 (×6): 0.5 mg via INTRAVENOUS
  Filled 2018-02-05 (×7): qty 1

## 2018-02-05 MED ORDER — SODIUM CHLORIDE 0.9 % IV SOLN
INTRAVENOUS | Status: DC
  Administered 2018-02-06: 09:00:00 via INTRAVENOUS
  Administered 2018-02-07: 20 mL/h via INTRAVENOUS

## 2018-02-05 MED ORDER — BISACODYL 10 MG RE SUPP: 10.0000 mg | Freq: Every day | RECTAL | Status: DC | PRN

## 2018-02-05 MED ORDER — LEVOTHYROXINE SODIUM 100 MCG IV SOLR
45.0000 ug | Freq: Every day | INTRAVENOUS | Status: DC
  Administered 2018-02-06: 45 ug via INTRAVENOUS
  Filled 2018-02-05: qty 5

## 2018-02-05 MED ORDER — IPRATROPIUM-ALBUTEROL 0.5-2.5 (3) MG/3ML IN SOLN
3.0000 mL | Freq: Two times a day (BID) | RESPIRATORY_TRACT | Status: DC
  Filled 2018-02-05: qty 3

## 2018-02-05 MED ORDER — HALOPERIDOL 0.5 MG PO TABS
0.5000 mg | ORAL_TABLET | ORAL | Status: DC | PRN
  Filled 2018-02-05: qty 1

## 2018-02-05 MED ORDER — GLYCOPYRROLATE 1 MG PO TABS
1.0000 mg | ORAL_TABLET | ORAL | Status: DC | PRN
  Filled 2018-02-05: qty 1

## 2018-02-05 NOTE — Progress Notes (Signed)
Pharmacy Antibiotic Note  Joshua Dixon is a 84 y.o. male admitted on 02/03/2018 with c/o productive cough, weakness, fall PTA.  Rocephin/Azithromycin begun for CAP.  Pharmacy has been consulted 5/3 for Vancomycin & Zosyn dosing.  Plan: Zosyn 3.375g IV q8h (4 hour infusion).  Vancomycin 1250mg  x1 only, random Vanc level ordered for 5/5 am Daily SCr - Concern with SCr increase to 1.48, combination Vancomycin with Zosyn increases risk for renal damage despite monitoring - MRSA PCR is negative - highly predictive that MRSA coverage is NOT needed, suggest early d/c of Vancomycin  Height: 5\' 8"  (172.7 cm) Weight: 150 lb (68 kg) IBW/kg (Calculated) : 68.4  Temp (24hrs), Avg:99.3 F (37.4 C), Min:98.5 F (36.9 C), Max:101 F (38.3 C)  Recent Labs  Lab 02/03/18 1350 02/03/18 1354 02/03/18 1917 02/03/18 2209 02/04/18 0324 02/05/18 0337  WBC 12.1*  --   --   --  11.4* 8.4  CREATININE 1.15  --   --   --  1.19 1.48*  LATICACIDVEN  --  1.53 1.7 1.6  --   --     Estimated Creatinine Clearance: 31.9 mL/min (A) (by C-G formula based on SCr of 1.48 mg/dL (H)).    No Known Allergies  Antimicrobials this admission: 5/1 Rocephin >> 5/3 5/1 Azithromycin >> 5/3 5/3 Vanc >> 5/3 Zosyn >>  Dose adjustments this admission:  Microbiology results: 5/1 BCx: ngtd 5/1 UCx: ng-Final 5/1 MRSA PCR: neg 5/1 Strep pneumo: neg 5/1 Flu panel: neg/neg  5/1 Legionella: pending  Thank you for allowing pharmacy to be a part of this patient's care.  Minda Ditto PharmD Pager [phone] 02/05/2018, 9:51 AM

## 2018-02-05 NOTE — Progress Notes (Signed)
Daily Progress Note   Patient Name: Joshua Dixon       Date: 02/05/2018 DOB: Feb 14, 1926  Age: 84 y.o. MRN#: [id number] Attending Physician: Alma Friendly, MD Primary Care Physician: Seward Carol, MD Admit Date: 02/03/2018  Reason for Consultation/Follow-up: Establishing goals of care  Subjective:  I was alerted by bedside RN earlier this am that family is considering full scope of comfort measures  I arrived at the bedside this am  Patient continues to be rest less and agitated, he keeps asking for a knife.   Wife, son daughter in law and several other family members at bedside  Labs and pertinent imaging reviewed and discussed in detail with family, further goals of care discussions ensued, see below:   Length of Stay: 2  Current Medications: Scheduled Meds:  . brimonidine  1 drop Right Eye BID  . dorzolamide  1 drop Right Eye BID  . enoxaparin (LOVENOX) injection  40 mg Subcutaneous Q24H  . ipratropium-albuterol  3 mL Nebulization BID  . latanoprost  1 drop Both Eyes QHS  . levothyroxine  45 mcg Intravenous Daily  . sodium chloride flush  3 mL Intravenous Q12H    Continuous Infusions: . sodium chloride    . sodium chloride    . piperacillin-tazobactam (ZOSYN)  IV    . vancomycin      PRN Meds: sodium chloride, acetaminophen **OR** acetaminophen, albuterol, antiseptic oral rinse, bisacodyl, glycopyrrolate **OR** glycopyrrolate **OR** glycopyrrolate, haloperidol **OR** haloperidol **OR** haloperidol lactate, HYDROmorphone (DILAUDID) injection, LORazepam **OR** LORazepam **OR** LORazepam, ondansetron **OR** ondansetron (ZOFRAN) IV, polyvinyl alcohol, sodium chloride flush, traZODone  Physical Exam         Frail elderly gentleman Contractures Poor dentition Rapid heart  rate No edema Thin and weak appearing At times tachypneic Abdomen not distended  Vital Signs: BP (!) 101/55   Pulse (!) 116   Temp 100.1 F (37.8 C) (Axillary)   Resp (!) 44   Ht 5\' 8"  (1.727 m)   Wt 68 kg (150 lb)   SpO2 100%   BMI 22.81 kg/m  SpO2: SpO2: 100 % O2 Device: O2 Device: Nasal Cannula O2 Flow Rate: O2 Flow Rate (L/min): 4 L/min  Intake/output summary:   Intake/Output Summary (Last 24 hours) at 02/05/2018 1055 Last data filed at 02/05/2018 8891 Gross per 24 hour  Intake  450 ml  Output 2600 ml  Net -2150 ml   LBM: Last BM Date: 02/05/18 Baseline Weight: Weight: 68 kg (150 lb) Most recent weight: Weight: 68 kg (150 lb)       Palliative Assessment/Data: PPS 20%     Patient Active Problem List   Diagnosis Date Noted  . Dyspnea   . Encounter for palliative care   . Goals of care, counseling/discussion   . Sepsis ([place]) 02/03/2018  . Prostate cancer ([place]) 02/03/2018  . Hypothyroidism 02/03/2018  . Glaucoma 02/03/2018  . Hyperlipidemia 02/03/2018  . BPH (benign prostatic hyperplasia) 02/03/2018    Palliative Care Assessment & Plan   Patient Profile:    Assessment:  sepsis Delirium/agitation PNA Volume overload Elevated pro calcitonin Possible UTI Contracted gall bladder, ? Cholecystitis.  Worsening renal function. Gradual progressive functional decline, prior to this hospitalization.   Recommendations/Plan:   Goals of care discussions undertaken again, scope of comfort measures only discussed in detail. Family, including wife state that full comfort care would best serve this patient at this time, and that it would be most prudent.   Discussed about end of life order set  Chaplain to see, patient and his family are of the [place].   Comfort cart for family  Discussed in detail about hospice philosophy of care, the type of care that can be provided in a residential hospice setting also discussed in detail. Family is agreeable, CSW  consult placed to help facilitate.     Code Status:    Code Status Orders  (From admission, onward)        Start     Ordered   02/05/18 1043  Do not attempt resuscitation (DNR)  Continuous    Question Answer Comment  In the event of cardiac or respiratory ARREST Do not call a "code blue"   In the event of cardiac or respiratory ARREST Do not perform Intubation, CPR, defibrillation or ACLS   In the event of cardiac or respiratory ARREST Use medication by any route, position, wound care, and other measures to relive pain and suffering. May use oxygen, suction and manual treatment of airway obstruction as needed for comfort.      02/05/18 1043    Code Status History    Date Active Date Inactive Code Status Order ID Comments User Context   02/05/2018 1041 02/05/2018 1043 DNR [id number]  Loistine Chance, MD Inpatient   02/04/2018 1235 02/05/2018 1041 Partial Code [id number]  Alma Friendly, MD Inpatient   02/04/2018 0956 02/04/2018 1235 Partial Code [id number]  Alma Friendly, MD Inpatient   02/03/2018 1858 02/04/2018 0956 Full Code [id number]  Alma Friendly, MD ED    Advance Directive Documentation     Most Recent Value  Type of Advance Directive  Healthcare Power of Attorney  Pre-existing out of facility DNR order (yellow form or pink MOST form)  -  "MOST" Form in Place?  -       Prognosis:   < 2 weeks  Discharge Planning:  Hospice facility  Care plan was discussed with  Patient wife son daughter in law bedside RN  Thank you for allowing the Palliative Medicine Team to assist in the care of this patient.   Time In: 10 Time Out: 10.50 Total Time 50 Prolonged Time Billed No       Greater than 50%  of this time was spent counseling and coordinating care related to the above assessment and plan.  Loistine Chance, MD [phone]  2060  Please contact Palliative Medicine Team phone at [phone] for questions and concerns.

## 2018-02-05 NOTE — Clinical Social Work Note (Signed)
Clinical Social Work Assessment  Patient Details  Name: Joshua Dixon MRN: [id number] Date of Birth: 1926-04-26  Date of referral:  02/05/18               Reason for consult:  Discharge Planning, End of Life/Hospice                Permission sought to share information with:  Facility Art therapist granted to share information::     Name::        Agency::     Relationship::     Contact Information:     Housing/Transportation Living arrangements for the past 2 months:  [place] of Information:  Patient Patient Interpreter Needed:  None Criminal Activity/Legal Involvement Pertinent to Current Situation/Hospitalization:  No - Comment as needed Significant Relationships:    Lives with:  Self Do you feel safe going back to the place where you live?  No Need for family participation in patient care:  Yes, Residential Hospice.   Care giving concerns:   [facility].   Social Worker assessment / plan:  CSW met with the patient daughter in [place] and [place] liaison. Patient spouse and other family members visiting patient at the time of assessment. Patient daughter in law reports the family is agreeable to [facility]. Unfortunately, the facility does not have bed availability today. Patient daughter express going to another hospice " will be difficult for the family." She reports the patient son is currently receiving chemo treatments and his elderly wife is unable to drive long distances.  She report the patient has been bed bound and sick for sometime. The patient and family are prepared for the transition.   Plan: Residential Hospice  Employment status:  Retired Nurse, adult PT Recommendations:  Not assessed at this time Information / Referral to community resources:     Patient/Family's Response to care:  Agreeable to residential transition.   Patient/Family's Understanding of and Emotional  Response to Diagnosis, Current Treatment, and Prognosis:  Patient adult children have good understanding of patient diagnosis and prognosis.   Emotional Assessment Appearance:  Developmentally appropriate, Appears stated age Attitude/Demeanor/Rapport:    Affect (typically observed):  Unable to Assess Orientation:    Alcohol / Substance use:  Not Applicable Psych involvement (Current and /or in the community):  No (Comment)  Discharge Needs  Concerns to be addressed:  Discharge Planning Concerns Readmission within the last 30 days:  No Current discharge risk:  Dependent with Mobility Barriers to Discharge:  Continued Medical Work up, Hospice Bed not available   Lia Hopping, [place] 02/05/2018, 12:21 PM

## 2018-02-05 NOTE — Care Management Note (Addendum)
Case Management Note  Patient Details  Name: Joshua Dixon MRN: [id number] Date of Birth: Dec 09, 1925  Subjective/Objective:                  uti  Versus hypokalemia and confusion in a 84 year old male.  Action/Plan: Date: Feb 05, 2018 Velva Harman, BSN, [place], [place] Chart and notes review for patient progress and needs. Will follow for case management and discharge needs.lives at home with spouse has family support. No cm or discharge needs present at time of this review. Next review date: [id number]  Expected Discharge Date:  02/04/18               Expected Discharge Plan:  Home/Self Care  In-House Referral:     Discharge planning Services  CM Consult  Post Acute Care Choice:    Choice offered to:     DME Arranged:    DME Agency:     HH Arranged:    HH Agency:     Status of Service:  In process, will continue to follow  If discussed at Long Length of Stay Meetings, dates discussed:    Additional Comments:  Leeroy Cha, RN 02/05/2018, 8:41 AM

## 2018-02-05 NOTE — Progress Notes (Signed)
Pt rested quietly until 0230.  Woke up combative, calling for wife.  Threatening to call police.  Pt reassured, re oriented and re directed.  Ativan given.  Will monitor closely.

## 2018-02-05 NOTE — Progress Notes (Signed)
   02/05/18 1400  Clinical Encounter Type  Visited With Family  Visit Type Initial;Psychological support;Spiritual support;Critical Care  Referral From Nurse  Consult/Referral To Chaplain  Spiritual Encounters  Spiritual Needs Emotional;Other (Comment) (Spiritual Care Conversation/Support)  Stress Factors  Patient Stress Factors Not reviewed  Family Stress Factors Health changes;Loss;Major life changes   I visited with the patient's family who were present at the bedside. The family had just visited with their [place] before my visit. No needs were identified at this time.   Please, contact Spiritual Care for further assistance.  [place] M.Div. , [facility]

## 2018-02-05 NOTE — Progress Notes (Addendum)
PROGRESS NOTE  Thaine Garriga [id number]:[id number] DOB: 1925-10-30 DOA: 02/03/2018 PCP: Seward Carol, MD  HPI/Recap of past 24 hours: Yussef Jorge is a 84 y.o. male with medical history significant for HLD, hypothyroidism, osteoporosis, anemia, prostate CA on leupron, BPH, glaucoma was brought by his family for evaluation of cough, AMS, generalized weakness and fall. Per son and wife, pt has had productive cough of yellowish sputum for the past couple of days. Pt has been noted to be much weaker generally, resulting in a fall PTA, no report of him hitting his head or LOC. Pt has been also more confused. Per EMS report, had a foul odor to his urine. In the ED, CXR showed ?PNA, due to elevated bilirubin >2, code sepsis was activated due to SIRS criteria. Pt admitted for further management.  Overnight, pt noted to be much restless, still tachypneic, tachycardic, with soft BP. Overall worsening clinical status. Family agreed to switch to comfort measures. Palliative on board. Awaiting bed at [facility]. Transfer to med-surg unit.   Assessment/Plan: Principal Problem:   Sepsis ([place]) Active Problems:   Prostate cancer (HCC)   Hypothyroidism   Glaucoma   Hyperlipidemia   BPH (benign prostatic hyperplasia)   Dyspnea   Encounter for palliative care   Goals of care, counseling/discussion   Respiratory distress  Acute hypoxic respiratory failure No improvement Likely due to vol overload 2/2 IVF resuscitation for hypotension/shock Unable to tolerate bipap, switch to O2 [place], maintaining sats, but increased WOB CXR: Pulmonary edema Stop IV lasix due to worsening renal fxn, soft BP [place] Palliative team on board, comfort measures    Septic shock likely due to ?CAP BP soft Pt initially admitted for sepsis, hypotensive s/p 3L of IVF Afebrile with resolved leukocytosis LA WNL, Procalcitonin trending up 0.3-->0.97-->7.34 Influenza negative BC X 2, NGTD Urine legionella negative, strep pneumo negative CXR  with no focal consolidation, repeat with pulm edema Continue IV Zosyn Palliative on board  AKI Likely due to worsening sepsis, lasix D/C lasix  Hypokalemia Replace prn  Elevated liver enzymes Noted on presentation, worsening (?hypotensive episode) RUQ USS ?? Cholecystitis D/c tylenol, statins  Generalized weakness with fall Likely due to above CT head: No acute abnormality Fall precautions  Acute metabolic encephalopathy with ??underlying dementia Son reports some cognitive impairments, but does note worsening confusion over the past couple of days Likely due to above Management as above  Normocytic anemia Hgb 9.8, no baseline to compare Iron panel: iron 29, sats 17, ferritin 260  Hypothyroidism TSH WNL Continue synthroid  Hyperlipidemia D/C statins on discharge, LDL at goal, elderly, elevated LFTs  Glaucoma  Continue home eye drops  Prostate Ca On lupron every 6 months  Goals of care Family in agreement to transition to comfort measures, awaiting bed placement at [facility]. Palliative on board, changed to DNR. Transfer to med-surg    Code Status: DNR  Family Communication: Spoke with daughter-in-law who is the POA, wife and son  Disposition Plan: [facility]   Consultants:  Palliative  Procedures:  None  Antimicrobials:  IV Zosyn  DVT prophylaxis: Lovenox   Objective: Vitals:   02/05/18 0800 02/05/18 0900 02/05/18 1000 02/05/18 1100  BP: (!) 104/54 (!) 102/51 (!) 101/55 (!) 99/58  Pulse: 99 (!) 103 (!) 116 (!) 104  Resp: (!) 46 (!) 36 (!) 44 (!) 45  Temp: 100.1 F (37.8 C)     TempSrc: Axillary     SpO2: 100% 96% 100% 100%  Weight:  Height:        Intake/Output Summary (Last 24 hours) at 02/05/2018 1743 Last data filed at 02/05/2018 1000 Gross per 24 hour  Intake 450 ml  Output 2125 ml  Net -1675 ml   Filed Weights   02/03/18 1329  Weight: 68 kg (150 lb)    Exam:   General: In moderate distress, tachypneic,  very restless  Cardiovascular: S1, S2 present  Respiratory: Tachypnea, bilateral crackles heard  Abdomen: Soft, nontender, nondistended, bowel sounds present  Musculoskeletal: No pedal edema bilaterally, laceration noted on the left anterior shin  Skin: Multiple bruises noted on BLE  Psychiatry: In distress   Data Reviewed: CBC: Recent Labs  Lab 02/03/18 1350 02/04/18 0324 02/05/18 0337  WBC 12.1* 11.4* 8.4  NEUTROABS 11.1*  --  7.3  HGB 9.8* 9.3* 9.2*  HCT 30.1* 29.6* 28.5*  MCV 91.5 94.6 92.5  PLT 142* 164 341*   Basic Metabolic Panel: Recent Labs  Lab 02/03/18 1350 02/04/18 0324 02/05/18 0337  NA 142 145 145  K 3.5 3.7 2.9*  CL 110 117* 116*  CO2 22 19* 19*  GLUCOSE 124* 151* 88  BUN 15 19 27*  CREATININE 1.15 1.19 1.48*  CALCIUM 8.6* 7.3* 7.0*   GFR: Estimated Creatinine Clearance: 31.9 mL/min (A) (by C-G formula based on SCr of 1.48 mg/dL (H)). Liver Function Tests: Recent Labs  Lab 02/03/18 1350 02/04/18 0324 02/05/18 0337  AST 72* 195* 189*  ALT 35 83* 67*  ALKPHOS 79 70 55  BILITOT 3.0* 2.3* 1.5*  PROT 6.9 6.0* 5.6*  ALBUMIN 3.6 3.1* 2.8*   No results for input(s): LIPASE, AMYLASE in the last 168 hours. No results for input(s): AMMONIA in the last 168 hours. Coagulation Profile: Recent Labs  Lab 02/03/18 1350  INR 1.16   Cardiac Enzymes: No results for input(s): CKTOTAL, CKMB, CKMBINDEX, TROPONINI in the last 168 hours. BNP (last 3 results) No results for input(s): PROBNP in the last 8760 hours. HbA1C: Recent Labs    02/04/18 0324  HGBA1C 4.7*   CBG: Recent Labs  Lab 02/04/18 0716 02/04/18 2152  GLUCAP 108* 93   Lipid Profile: Recent Labs    02/04/18 0324  CHOL 99  HDL 55  LDLCALC 36  TRIG 40  CHOLHDL 1.8   Thyroid Function Tests: Recent Labs    02/04/18 0324  TSH 0.980   Anemia Panel: Recent Labs    02/04/18 0324  FERRITIN 260  TIBC 172*  IRON 29*   Urine analysis:    Component Value Date/Time    COLORURINE YELLOW 02/03/2018 [place] 02/03/2018 1644   LABSPEC 1.014 02/03/2018 1644   PHURINE 7.0 02/03/2018 1644   GLUCOSEU NEGATIVE 02/03/2018 1644   HGBUR NEGATIVE 02/03/2018 [place] 02/03/2018 [place] 02/03/2018 1644   PROTEINUR NEGATIVE 02/03/2018 1644   NITRITE NEGATIVE 02/03/2018 1644   LEUKOCYTESUR NEGATIVE 02/03/2018 1644   Sepsis Labs: @LABRCNTIP (procalcitonin:4,lacticidven:4)  ) Recent Results (from the past 240 hour(s))  Urine culture     Status: None   Collection Time: 02/03/18  4:44 PM  Result Value Ref Range Status   Specimen Description   Final    URINE, RANDOM Performed at [facility], [place] [address] [place], [place] [address]    Special Requests   Final    NONE Performed at [facility], [place] [address] [place], [place] [address]    Culture   Final    NO  GROWTH Performed at [facility], [place] [address], [place], [place] [address]    Report Status 02/05/2018 FINAL  Final  Culture, blood (routine x 2)     Status: None (Preliminary result)   Collection Time: 02/03/18  5:39 PM  Result Value Ref Range Status   Specimen Description   Final    BLOOD RIGHT FOREARM Performed at [place] [address] [place], [place] [address]    Special Requests   Final    BOTTLES DRAWN AEROBIC AND ANAEROBIC Blood Culture adequate volume Performed at [place] [address], [place], [place] [address]    Culture   Final    NO GROWTH 2 DAYS Performed at [place] [address], [place], [place] [address]    Report Status PENDING  Incomplete  Culture, blood (routine x 2)     Status: None (Preliminary result)   Collection Time: 02/03/18  5:46 PM  Result Value Ref Range Status   Specimen Description   Final    BLOOD RIGHT ANTECUBITAL Performed at [place] [address], [place],  [place] [address]    Special Requests   Final    BOTTLES DRAWN AEROBIC AND ANAEROBIC Blood Culture results may not be optimal due to an excessive volume of blood received in culture bottles Performed at [place] [address], [place], [place] [address]    Culture   Final    NO GROWTH 2 DAYS Performed at [place] [address] [place], [place] [address]    Report Status PENDING  Incomplete  MRSA PCR Screening     Status: None   Collection Time: 02/03/18  8:41 PM  Result Value Ref Range Status   MRSA by PCR NEGATIVE NEGATIVE Final    Comment:        The GeneXpert MRSA Assay (FDA approved for NASAL specimens only), is one component of a comprehensive MRSA colonization surveillance program. It is not intended to diagnose MRSA infection nor to guide or monitor treatment for MRSA infections. Performed at [facility], [place] [address], [place], [place] [address]       Studies: No results found.  Scheduled Meds: . brimonidine  1 drop Right Eye BID  . dorzolamide  1 drop Right Eye BID  . enoxaparin (LOVENOX) injection  40 mg Subcutaneous Q24H  . latanoprost  1 drop Both Eyes QHS  . levothyroxine  45 mcg Intravenous Daily  . sodium chloride flush  3 mL Intravenous Q12H    Continuous Infusions: . sodium chloride    . sodium chloride    . piperacillin-tazobactam (ZOSYN)  IV       LOS: 2 days     Alma Friendly, MD [facility]   If 7PM-7AM, please contact night-coverage [email or web address] Password [facility] 02/05/2018, 5:43 PM

## 2018-02-05 NOTE — Progress Notes (Signed)
[facility] Liaison: ? Received a request from [place] for family interest in [facility]  Met with daughter-in-law, Lynelle Smoke to confirm interest and answer questions. Unfortunately, there is no bed availability at [facility] today.  [facility] hospital liaison will follow-up with family tomorrow re: bed availability. Elmyra Ricks, [place] updated.  Thank You,  Freddi Starr RN, [facility] Liaison [phone]

## 2018-02-05 NOTE — Progress Notes (Signed)
RT placed the [facility] on the Pt and he pulled it off and told me to get out of his room. RT charted not given

## 2018-02-05 NOTE — Progress Notes (Signed)
Xray tech unable to take xray due to patient being combative and not staying on xray board/marker.

## 2018-02-05 NOTE — Progress Notes (Signed)
Pt woke up combative, swinging at RN, trying to pull off safety mitts.  [facility] notified of pts behavior and lab values.  Orders received.

## 2018-02-06 ENCOUNTER — Encounter (HOSPITAL_COMMUNITY): Payer: Self-pay | Admitting: *Deleted

## 2018-02-06 LAB — GLUCOSE, CAPILLARY: GLUCOSE-CAPILLARY: 86 mg/dL (ref 65–99)

## 2018-02-06 NOTE — Progress Notes (Signed)
Pt's wife asked for pt's jewelry.  Nurse assisted and gave pt's wife his gold bracelet, watch, and 2 gold rings.

## 2018-02-06 NOTE — Progress Notes (Signed)
PROGRESS NOTE  Joshua Dixon [id number]:[id number] DOB: Jul 31, 1926 DOA: 02/03/2018 PCP: Seward Carol, MD  HPI/Recap of past 24 hours: Joshua Dixon is a 84 y.o. male with medical history significant for HLD, hypothyroidism, osteoporosis, anemia, prostate CA on leupron, BPH, glaucoma was brought by his family for evaluation of cough, AMS, generalized weakness and fall. Per son and wife, pt has had productive cough of yellowish sputum for the past couple of days. Pt has been noted to be much weaker generally, resulting in a fall PTA, no report of him hitting his head or LOC. Pt has been also more confused. Per EMS report, had a foul odor to his urine. In the ED, CXR showed ?PNA, due to elevated bilirubin >2, code sepsis was activated due to SIRS criteria. Pt admitted for further management.  Today, met pt resting, was just given haldol, less tachypneic. Awaiting bed placement at [facility].   Assessment/Plan: Principal Problem:   Sepsis ([place]) Active Problems:   Prostate cancer (HCC)   Hypothyroidism   Glaucoma   Hyperlipidemia   BPH (benign prostatic hyperplasia)   Dyspnea   Encounter for palliative care   Goals of care, counseling/discussion   Respiratory distress  Acute hypoxic respiratory failure No improvement Likely due to vol overload 2/2 IVF resuscitation for hypotension/shock Unable to tolerate bipap, switch to O2 [place], maintaining sats, but increased WOB CXR: Pulmonary edema Stop IV lasix due to worsening renal fxn, soft BP [place] Palliative team on board, comfort measures    Septic shock likely due to ?CAP BP soft Pt initially admitted for sepsis, hypotensive s/p 3L of IVF Afebrile with resolved leukocytosis LA WNL, Procalcitonin trending up 0.3-->0.97-->7.34 Influenza negative BC X 2, NGTD Urine legionella negative, strep pneumo negative CXR with no focal consolidation, repeat with pulm edema Palliative on board  AKI Likely due to worsening sepsis, lasix D/C  lasix  Hypokalemia Replace prn  Elevated liver enzymes Noted on presentation, worsening (?hypotensive episode) RUQ USS ?? Cholecystitis D/c tylenol, statins  Generalized weakness with fall Likely due to above CT head: No acute abnormality Fall precautions  Acute metabolic encephalopathy with ??underlying dementia Son reports some cognitive impairments, but does note worsening confusion over the past couple of days Likely due to above Management as above  Normocytic anemia Hgb 9.8, no baseline to compare Iron panel: iron 29, sats 17, ferritin 260  Hypothyroidism TSH WNL  Hyperlipidemia D/C statins on discharge, LDL at goal, elderly, elevated LFTs  Glaucoma  Continue home eye drops  Prostate Ca On lupron every 6 months  Goals of care Family in agreement to transition to comfort measures, awaiting bed placement at [facility]. Palliative on board, changed to DNR.    Code Status: DNR  Family Communication: Spoke with daughter-in-law who is the POA, wife and son  Disposition Plan: [facility]   Consultants:  Palliative  Procedures:  None  Antimicrobials:  None  DVT prophylaxis: SCDs   Objective: Vitals:   02/05/18 1000 02/05/18 1100 02/06/18 0101 02/06/18 1352  BP: (!) 101/55 (!) 99/58 99/68 107/66  Pulse: (!) 116 (!) 104 63 100  Resp: (!) 44 (!) 45 20 (!) 42  Temp:   98.7 F (37.1 C) 97.7 F (36.5 C)  TempSrc:   Axillary Axillary  SpO2: 100% 100% 95% 100%  Weight:      Height:        Intake/Output Summary (Last 24 hours) at 02/06/2018 1622 Last data filed at 02/06/2018 1312 Gross per 24 hour  Intake 75  ml  Output 700 ml  Net -625 ml   Filed Weights   02/03/18 1329  Weight: 68 kg (150 lb)    Exam:   General: In moderate distress, tachypneic, calmer today  Cardiovascular: S1, S2 present  Respiratory: bilateral crackles heard  Abdomen: Soft, nontender, nondistended, bowel sounds present  Musculoskeletal: No pedal edema  bilaterally, laceration noted on the left anterior shin  Skin: Multiple bruises noted on BLE  Psychiatry: Unable to assess   Data Reviewed: CBC: Recent Labs  Lab 02/03/18 1350 02/04/18 0324 02/05/18 0337  WBC 12.1* 11.4* 8.4  NEUTROABS 11.1*  --  7.3  HGB 9.8* 9.3* 9.2*  HCT 30.1* 29.6* 28.5*  MCV 91.5 94.6 92.5  PLT 142* 164 329*   Basic Metabolic Panel: Recent Labs  Lab 02/03/18 1350 02/04/18 0324 02/05/18 0337  NA 142 145 145  K 3.5 3.7 2.9*  CL 110 117* 116*  CO2 22 19* 19*  GLUCOSE 124* 151* 88  BUN 15 19 27*  CREATININE 1.15 1.19 1.48*  CALCIUM 8.6* 7.3* 7.0*   GFR: Estimated Creatinine Clearance: 31.9 mL/min (A) (by C-G formula based on SCr of 1.48 mg/dL (H)). Liver Function Tests: Recent Labs  Lab 02/03/18 1350 02/04/18 0324 02/05/18 0337  AST 72* 195* 189*  ALT 35 83* 67*  ALKPHOS 79 70 55  BILITOT 3.0* 2.3* 1.5*  PROT 6.9 6.0* 5.6*  ALBUMIN 3.6 3.1* 2.8*   No results for input(s): LIPASE, AMYLASE in the last 168 hours. No results for input(s): AMMONIA in the last 168 hours. Coagulation Profile: Recent Labs  Lab 02/03/18 1350  INR 1.16   Cardiac Enzymes: No results for input(s): CKTOTAL, CKMB, CKMBINDEX, TROPONINI in the last 168 hours. BNP (last 3 results) No results for input(s): PROBNP in the last 8760 hours. HbA1C: Recent Labs    02/04/18 0324  HGBA1C 4.7*   CBG: Recent Labs  Lab 02/04/18 0716 02/04/18 2152  GLUCAP 108* 93   Lipid Profile: Recent Labs    02/04/18 0324  CHOL 99  HDL 55  LDLCALC 36  TRIG 40  CHOLHDL 1.8   Thyroid Function Tests: Recent Labs    02/04/18 0324  TSH 0.980   Anemia Panel: Recent Labs    02/04/18 0324  FERRITIN 260  TIBC 172*  IRON 29*   Urine analysis:    Component Value Date/Time   COLORURINE YELLOW 02/03/2018 [place] 02/03/2018 1644   LABSPEC 1.014 02/03/2018 1644   PHURINE 7.0 02/03/2018 1644   GLUCOSEU NEGATIVE 02/03/2018 1644   HGBUR NEGATIVE  02/03/2018 [place] 02/03/2018 [place] 02/03/2018 1644   PROTEINUR NEGATIVE 02/03/2018 1644   NITRITE NEGATIVE 02/03/2018 1644   LEUKOCYTESUR NEGATIVE 02/03/2018 1644   Sepsis Labs: @LABRCNTIP (procalcitonin:4,lacticidven:4)  ) Recent Results (from the past 240 hour(s))  Urine culture     Status: None   Collection Time: 02/03/18  4:44 PM  Result Value Ref Range Status   Specimen Description   Final    URINE, RANDOM Performed at [facility], [place] [address], [place], [place] [address]    Special Requests   Final    NONE Performed at [facility], [place] [address], [place], [place] [address]    Culture   Final    NO GROWTH Performed at [facility], [place] [address] [place], [place] [address]    Report Status 02/05/2018 FINAL  Final  Culture, blood (routine x 2)  Status: None (Preliminary result)   Collection Time: 02/03/18  5:39 PM  Result Value Ref Range Status   Specimen Description   Final    BLOOD RIGHT FOREARM Performed at [place] [address], [place], [place] [address]    Special Requests   Final    BOTTLES DRAWN AEROBIC AND ANAEROBIC Blood Culture adequate volume Performed at [place] [address], [place], [place] [address]    Culture   Final    NO GROWTH 3 DAYS Performed at [facility], [place] [address], [place], [place] [address]    Report Status PENDING  Incomplete  Culture, blood (routine x 2)     Status: None (Preliminary result)   Collection Time: 02/03/18  5:46 PM  Result Value Ref Range Status   Specimen Description   Final    BLOOD RIGHT ANTECUBITAL Performed at [place] [address] [place], [place] [address]    Special Requests   Final    BOTTLES DRAWN AEROBIC AND ANAEROBIC Blood Culture results may not be optimal due to an excessive volume of blood received in culture  bottles Performed at [place] [address], [place], [place] [address]    Culture   Final    NO GROWTH 3 DAYS Performed at [facility], [place] [address], [place], [place] [address]    Report Status PENDING  Incomplete  MRSA PCR Screening     Status: None   Collection Time: 02/03/18  8:41 PM  Result Value Ref Range Status   MRSA by PCR NEGATIVE NEGATIVE Final    Comment:        The GeneXpert MRSA Assay (FDA approved for NASAL specimens only), is one component of a comprehensive MRSA colonization surveillance program. It is not intended to diagnose MRSA infection nor to guide or monitor treatment for MRSA infections. Performed at [facility], [place] [address], [place],  [address]       Studies: No results found.  Scheduled Meds: . brimonidine  1 drop Right Eye BID  . dorzolamide  1 drop Right Eye BID  . latanoprost  1 drop Both Eyes QHS  . sodium chloride flush  3 mL Intravenous Q12H    Continuous Infusions: . sodium chloride    . sodium chloride 10 mL/hr at 02/06/18 0909     LOS: 3 days     Alma Friendly, MD [facility]   If 7PM-7AM, please contact night-coverage [email or web address] Password [email or web address] 02/06/2018, 4:22 PM

## 2018-02-06 NOTE — Progress Notes (Signed)
Report received from [facility] ICU, pt received to room 1536 and transferred to bed without difficulty. Pt is a DNR. No family at the bedside.

## 2018-02-06 NOTE — Progress Notes (Signed)
[facility] RN note:  Met with family and contacted CSW that there is no bed availability for [facility] today. [facility] hospital liaison will follow-up with family tomorrow. CSW also updated.  Thank you,  [facility] Liaison [place] are on AMION

## 2018-02-06 NOTE — Progress Notes (Signed)
Pt's son Shanon Brow notified of pt's room change.

## 2018-02-07 MED ORDER — MORPHINE 100MG IN NS 100ML (1MG/ML) PREMIX INFUSION
1.0000 mg/h | INTRAVENOUS | Status: DC
  Administered 2018-02-07: 1 mg/h via INTRAVENOUS
  Filled 2018-02-07: qty 100

## 2018-02-07 NOTE — Plan of Care (Signed)
  Problem: Pain Managment: Goal: General experience of comfort will improve Outcome: Progressing   

## 2018-02-07 NOTE — Progress Notes (Signed)
PROGRESS NOTE  Joshua Dixon ENI:[id number] DOB: Apr 13, 1926 DOA: 02/03/2018 PCP: Seward Carol, MD  HPI/Recap of past 24 hours: Joshua Dixon is a 84 y.o. male with medical history significant for HLD, hypothyroidism, osteoporosis, anemia, prostate CA on leupron, BPH, glaucoma was brought by his family for evaluation of cough, AMS, generalized weakness and fall. Per son and wife, pt has had productive cough of yellowish sputum for the past couple of days. Pt has been noted to be much weaker generally, resulting in a fall PTA, no report of him hitting his head or LOC. Pt has been also more confused. Per EMS report, had a foul odor to his urine. In the ED, CXR showed ?PNA, due to elevated bilirubin >2, code sepsis was activated due to SIRS criteria. Pt admitted for further management.  Today, met pt sleeping, looks comfortable. Awaiting bed placement at [facility].   Assessment/Plan: Principal Problem:   Sepsis ([place]) Active Problems:   Prostate cancer (HCC)   Hypothyroidism   Glaucoma   Hyperlipidemia   BPH (benign prostatic hyperplasia)   Dyspnea   Encounter for palliative care   Goals of care, counseling/discussion   Respiratory distress  Acute hypoxic respiratory failure Likely due to vol overload 2/2 IVF resuscitation for hypotension/shock CXR: Pulmonary edema Palliative team on board, comfort measures    Septic shock likely due to ?CAP Pt initially admitted for sepsis, hypotensive s/p 3L of IVF LA WNL, Procalcitonin trended up 0.3-->0.97-->7.34 Influenza negative BC X 2, NGTD Urine legionella negative, strep pneumo negative CXR with no focal consolidation, repeat with pulm edema Palliative on board  AKI Likely due to worsening sepsis, lasix  Elevated liver enzymes Noted on presentation, (?hypotensive episode) RUQ USS ?? Cholecystitis D/c tylenol, statins  Generalized weakness with fall Likely due to above CT head: No acute abnormality Fall precautions  Acute metabolic  encephalopathy with ??underlying dementia Son reports some cognitive impairments, but does note worsening confusion over the past couple of days Likely due to above  Normocytic anemia Hgb 9.8, no baseline to compare Iron panel: iron 29, sats 17, ferritin 260  Hypothyroidism TSH WNL  Hyperlipidemia D/C statins  Glaucoma  Continue home eye drops  Prostate Ca On lupron every 6 months  Goals of care Family in agreement to transition to comfort measures, awaiting [facility]. Palliative on board, changed to DNR.    Code Status: DNR  Family Communication: Spoke with daughter-in-law who is the POA, wife and son  Disposition Plan: [facility]   Consultants:  Palliative  Procedures:  None  Antimicrobials:  None  DVT prophylaxis: SCDs   Objective: Vitals:   02/06/18 0101 02/06/18 1352 02/06/18 1700 02/06/18 2141  BP: 99/68 107/66  118/75  Pulse: 63 100  94  Resp: 20 (!) 42 (!) 46 16  Temp: 98.7 F (37.1 C) 97.7 F (36.5 C)  98.3 F (36.8 C)  TempSrc: Axillary Axillary  Oral  SpO2: 95% 100%  95%  Weight:      Height:        Intake/Output Summary (Last 24 hours) at 02/07/2018 1630 Last data filed at 02/07/2018 1501 Gross per 24 hour  Intake 5 ml  Output 550 ml  Net -545 ml   Filed Weights   02/03/18 1329  Weight: 68 kg (150 lb)    Exam:   General: sleeping  Cardiovascular: S1, S2 present  Respiratory: bilateral crackles heard  Abdomen: Soft, nontender, nondistended, bowel sounds present  Musculoskeletal: No pedal edema bilaterally, laceration  noted on the left anterior shin  Skin: Multiple bruises noted on BLE  Psychiatry: Unable to assess   Data Reviewed: CBC: Recent Labs  Lab 02/03/18 1350 02/04/18 0324 02/05/18 0337  WBC 12.1* 11.4* 8.4  NEUTROABS 11.1*  --  7.3  HGB 9.8* 9.3* 9.2*  HCT 30.1* 29.6* 28.5*  MCV 91.5 94.6 92.5  PLT 142* 164 638*   Basic Metabolic Panel: Recent Labs  Lab  02/03/18 1350 02/04/18 0324 02/05/18 0337  NA 142 145 145  K 3.5 3.7 2.9*  CL 110 117* 116*  CO2 22 19* 19*  GLUCOSE 124* 151* 88  BUN 15 19 27*  CREATININE 1.15 1.19 1.48*  CALCIUM 8.6* 7.3* 7.0*   GFR: Estimated Creatinine Clearance: 31.9 mL/min (A) (by C-G formula based on SCr of 1.48 mg/dL (H)). Liver Function Tests: Recent Labs  Lab 02/03/18 1350 02/04/18 0324 02/05/18 0337  AST 72* 195* 189*  ALT 35 83* 67*  ALKPHOS 79 70 55  BILITOT 3.0* 2.3* 1.5*  PROT 6.9 6.0* 5.6*  ALBUMIN 3.6 3.1* 2.8*   No results for input(s): LIPASE, AMYLASE in the last 168 hours. No results for input(s): AMMONIA in the last 168 hours. Coagulation Profile: Recent Labs  Lab 02/03/18 1350  INR 1.16   Cardiac Enzymes: No results for input(s): CKTOTAL, CKMB, CKMBINDEX, TROPONINI in the last 168 hours. BNP (last 3 results) No results for input(s): PROBNP in the last 8760 hours. HbA1C: No results for input(s): HGBA1C in the last 72 hours. CBG: Recent Labs  Lab 02/04/18 0716 02/04/18 2152 02/05/18 0743  GLUCAP 108* 93 86   Lipid Profile: No results for input(s): CHOL, HDL, LDLCALC, TRIG, CHOLHDL, LDLDIRECT in the last 72 hours. Thyroid Function Tests: No results for input(s): TSH, T4TOTAL, FREET4, T3FREE, THYROIDAB in the last 72 hours. Anemia Panel: No results for input(s): VITAMINB12, FOLATE, FERRITIN, TIBC, IRON, RETICCTPCT in the last 72 hours. Urine analysis:    Component Value Date/Time   COLORURINE YELLOW 02/03/2018 [place] 02/03/2018 1644   LABSPEC 1.014 02/03/2018 1644   PHURINE 7.0 02/03/2018 [place] 02/03/2018 1644   HGBUR NEGATIVE 02/03/2018 [place] 02/03/2018 [place] 02/03/2018 1644   PROTEINUR NEGATIVE 02/03/2018 1644   NITRITE NEGATIVE 02/03/2018 1644   LEUKOCYTESUR NEGATIVE 02/03/2018 1644   Sepsis Labs: @LABRCNTIP (procalcitonin:4,lacticidven:4)  ) Recent Results (from the past 240  hour(s))  Urine culture     Status: None   Collection Time: 02/03/18  4:44 PM  Result Value Ref Range Status   Specimen Description   Final    URINE, RANDOM Performed at [facility], [place] [address], [place], [place] [address]    Special Requests   Final    NONE Performed at [facility], [place] [address], [place], [place] [address]    Culture   Final    NO GROWTH Performed at [facility], [place] [address], [place], [place] [address]    Report Status 02/05/2018 FINAL  Final  Culture, blood (routine x 2)     Status: None (Preliminary result)   Collection Time: 02/03/18  5:39 PM  Result Value Ref Range Status   Specimen Description   Final    BLOOD RIGHT FOREARM Performed at [place] [address], [place], [place] [address]    Special Requests   Final    BOTTLES DRAWN AEROBIC AND ANAEROBIC Blood Culture adequate volume Performed at  [facility], [place] [address] [place], [place] [address]    Culture   Final    NO GROWTH 4 DAYS Performed at [facility], [place] [address], [place], [place] [address]    Report Status PENDING  Incomplete  Culture, blood (routine x 2)     Status: None (Preliminary result)   Collection Time: 02/03/18  5:46 PM  Result Value Ref Range Status   Specimen Description   Final    BLOOD RIGHT ANTECUBITAL Performed at [place] [address], [place], [place] [address]    Special Requests   Final    BOTTLES DRAWN AEROBIC AND ANAEROBIC Blood Culture results may not be optimal due to an excessive volume of blood received in culture bottles Performed at [place] [address] [place], [place] [address]    Culture   Final    NO GROWTH 4 DAYS Performed at [facility], [place] [address] [place], [place] [address]    Report Status PENDING  Incomplete  MRSA PCR Screening     Status: None   Collection Time: 02/03/18   8:41 PM  Result Value Ref Range Status   MRSA by PCR NEGATIVE NEGATIVE Final    Comment:        The GeneXpert MRSA Assay (FDA approved for NASAL specimens only), is one component of a comprehensive MRSA colonization surveillance program. It is not intended to diagnose MRSA infection nor to guide or monitor treatment for MRSA infections. Performed at [facility], [place] [address] [place], [place] [address]       Studies: No results found.  Scheduled Meds: . brimonidine  1 drop Right Eye BID  . dorzolamide  1 drop Right Eye BID  . latanoprost  1 drop Both Eyes QHS  . sodium chloride flush  3 mL Intravenous Q12H    Continuous Infusions: . sodium chloride    . sodium chloride 10 mL/hr at 02/06/18 0909     LOS: 4 days     Alma Friendly, MD [facility]   If 7PM-7AM, please contact night-coverage [email or web address] Password [email or web address] 02/07/2018, 4:30 PM

## 2018-02-07 NOTE — Progress Notes (Signed)
CSW spoke to [place], [facility]. They do not have a residential hospice bed available for patient today. CSW met with patient's family at bedside and updated them that no bed available. Family not agreeable at this time for an alternate hospice facility and prefer to wait for [facility]. Paged MD. CSW to follow.  Estanislado Emms, [place]

## 2018-02-08 DIAGNOSIS — Z23 Encounter for immunization: Secondary | ICD-10-CM | POA: Diagnosis not present

## 2018-02-08 LAB — CULTURE, BLOOD (ROUTINE X 2)
CULTURE: NO GROWTH
Culture: NO GROWTH
SPECIAL REQUESTS: ADEQUATE

## 2018-02-08 MED ORDER — HALOPERIDOL LACTATE 5 MG/ML IJ SOLN: 0.5000 mg | INTRAMUSCULAR | Status: AC | PRN

## 2018-02-08 MED ORDER — LORAZEPAM 2 MG/ML IJ SOLN: 1.0000 mg | INTRAMUSCULAR | 0 refills | Status: AC | PRN

## 2018-02-08 MED ORDER — GLYCOPYRROLATE 0.2 MG/ML IJ SOLN: 0.2000 mg | INTRAMUSCULAR | Status: AC | PRN

## 2018-02-08 MED ORDER — MORPHINE 100MG IN NS 100ML (1MG/ML) PREMIX INFUSION: 1.0000 mg/h | INTRAVENOUS | Status: AC

## 2018-02-08 MED ORDER — HYDROMORPHONE HCL 1 MG/ML IJ SOLN: 0.5000 mg | INTRAMUSCULAR | 0 refills | Status: AC | PRN

## 2018-02-08 MED ORDER — BISACODYL 10 MG RE SUPP: 10.0000 mg | Freq: Every day | RECTAL | 0 refills | Status: AC | PRN

## 2018-02-08 NOTE — Progress Notes (Signed)
[facility] Liaison RN visit.  Received request from [place] for family interest in [facility] with request for transfer today. Chart reviewed. Met with wife and daughter in law to confirm interest and explain services. Family agreeable to transfer today. CSW aware. Registration paperwork completed. Dr. Orpah Melter to assume care per family request. Please fax discharge summary to [phone]. RN please call report to [phone]. Please arrange transport for patient to arrive as soon as possible.  Thank you for this referral.  Please call with any hospice related questions or concerns.  Farrel Gordon, RN, [facility] Liaison  [place] are on AMION.

## 2018-02-08 NOTE — Discharge Summary (Signed)
Discharge Summary  Joshua Dixon IOM:[id number] DOB: 12-Apr-1926  PCP: [place] Carol, MD  Admit date: 02/03/2018 Discharge date: 02/08/2018  Time spent: 35 mins  Recommendations for Outpatient Follow-up:  Hospice  Discharge Diagnoses:  Active Hospital Problems   Diagnosis Date Noted  . Sepsis ([place]) 02/03/2018  . Respiratory distress   . Dyspnea   . Encounter for palliative care   . Goals of care, counseling/discussion   . Prostate cancer ([place]) 02/03/2018  . Hypothyroidism 02/03/2018  . Glaucoma 02/03/2018  . Hyperlipidemia 02/03/2018  . BPH (benign prostatic hyperplasia) 02/03/2018    Resolved Hospital Problems  No resolved problems to display.    Discharge Condition: Poor  Diet recommendation: None   Vitals:   02/07/18 2235 02/08/18 0626  BP: 113/76 129/78  Pulse: 94 85  Resp: (!) 35 (!) 24  Temp:    SpO2: 91% 96%    History of present illness:  Joshua Geieris a 84 y.o.malewith medical history significant forHLD, hypothyroidism, osteoporosis, anemia, prostate CA on leupron, BPH, glaucoma was brought by his family for evaluation of cough, AMS, generalized weakness and fall. Per son and wife, pt has had productive cough of yellowish sputum for the past couple of days. Pt has been noted to be much weaker generally, resulting in a fall PTA, no report of him hitting his head or LOC. Pt has been also more confused. Per EMS report, had a foul odor to his urine. In the ED, CXR showed ?PNA, due to elevated bilirubin >2, code sepsis was activated due to SIRS criteria. Pt admitted for further management. Pt went into acute respiratory failure after fluid resuscitation, and still remained hypotensive. Palliative team consulted, made DNR and eventually hospice. Pt to be transferred to [facility].  Today, met pt sleeping, family at bedside.   Hospital Course:  Principal Problem:   Sepsis ([place]) Active Problems:   Prostate cancer ([place])   Hypothyroidism   Glaucoma    Hyperlipidemia   BPH (benign prostatic hyperplasia)   Dyspnea   Encounter for palliative care   Goals of care, counseling/discussion   Respiratory distress  Acute hypoxic respiratory failure Likely due to vol overload 2/2 IVF resuscitation for hypotension/shock CXR: Pulmonary edema Palliative team on board, comfort measures    Septic shock likely due to ?CAP Pt initially admitted for sepsis, hypotensive s/p 3L of IVF LA WNL, Procalcitonin trended up 0.3-->0.97-->7.34 Influenza negative BC X 2, NGTD Urine legionella negative, strep pneumo negative CXR with no focal consolidation, repeat with pulm edema Palliative on board  AKI Likely due to worsening sepsis, lasix  Elevated liver enzymes Noted on presentation, (?hypotensive episode) RUQ USS ?? Cholecystitis  Generalized weakness with fall Likely due to above CT head: No acute abnormality Fall precautions  Acute metabolic encephalopathy with ??underlying dementia Son reports some cognitive impairments, but does note worsening confusion over the past couple of days Likely due to above  Normocytic anemia Hgb 9.8, no baseline to compare Iron panel: iron 29, sats 17, ferritin 260  Hypothyroidism TSH WNL  Hyperlipidemia D/C statins  Glaucoma  Prostate Ca      Procedures:  None  Consultations:  Palliative  Discharge Exam: BP 129/78 (BP Location: Left Wrist)   Pulse 85   Temp 98.3 F (36.8 C) (Oral)   Resp (!) 24   Ht _0  (1.727 m)   Wt 68 kg (150 lb)   SpO2 96%   BMI 22.81 kg/m   General: NAD  Cardiovascular: S1, S2 present Respiratory: Bilateral crackles  noted  Discharge Instructions You were cared for by a hospitalist during your hospital stay. If you have any questions about your discharge medications or the care you received while you were in the hospital after you are discharged, you can call the unit and asked to speak with the hospitalist on call if the hospitalist that took  care of you is not available. Once you are discharged, your primary care physician will handle any further medical issues. Please note that NO REFILLS for any discharge medications will be authorized once you are discharged, as it is imperative that you return to your primary care physician (or establish a relationship with a primary care physician if you do not have one) for your aftercare needs so that they can reassess your need for medications and monitor your lab values.  Discharge Instructions    Diet - None  Complete by:  As directed      Complete by:  As directed      Allergies as of 02/08/2018   No Known Allergies     Medication List    STOP taking these medications   brimonidine 0.2 % ophthalmic solution Commonly known as:  ALPHAGAN   CENTRUM ADULTS PO   CITRACAL PO   denosumab 60 MG/ML Sosy injection Commonly known as:  PROLIA   dorzolamide 2 % ophthalmic solution Commonly known as:  TRUSOPT   IRON PO   latanoprost 0.005 % ophthalmic solution Commonly known as:  XALATAN   levothyroxine 88 MCG tablet Commonly known as:  SYNTHROID, LEVOTHROID   lovastatin 10 MG tablet Commonly known as:  MEVACOR   terazosin 10 MG capsule Commonly known as:  HYTRIN   VITAMIN B-12 PO     TAKE these medications   bisacodyl 10 MG suppository Commonly known as:  DULCOLAX Place 1 suppository (10 mg total) rectally daily as needed for moderate constipation.   glycopyrrolate 0.2 MG/ML injection Commonly known as:  ROBINUL Inject 1 mL (0.2 mg total) into the vein every 4 (four) hours as needed (excessive secretions).   haloperidol lactate 5 MG/ML injection Commonly known as:  HALDOL Inject 0.1 mLs (0.5 mg total) into the vein every 4 (four) hours as needed (or delirium).   HYDROmorphone 1 MG/ML injection Commonly known as:  DILAUDID Inject 0.5 mLs (0.5 mg total) into the vein every 2 (two) hours as needed for severe pain (or dyspnea).   LORazepam 2 MG/ML  injection Commonly known as:  ATIVAN Inject 0.5 mLs (1 mg total) into the vein every 4 (four) hours as needed for anxiety.   morphine 1 mg/mL Soln infusion Inject 1 mg/hr into the vein continuous.      No Known Allergies    The results of significant diagnostics from this hospitalization (including imaging, microbiology, ancillary and laboratory) are listed below for reference.    Significant Diagnostic Studies: Dg Chest 2 View  Result Date: 02/03/2018 CLINICAL DATA:  Slid out of bed.  Suspected urinary tract infection. EXAM: CHEST - 2 VIEW COMPARISON:  Chest radiograph May 21, 2017 FINDINGS: Cardiac silhouette is mildly enlarged and unchanged. Coronary artery calcification and/or stent. Large hiatal hernia. Calcified aortic arch. No pleural effusion or focal consolidation. LEFT lung base atelectasis/scarring. No pneumothorax. Old RIGHT and healing LEFT rib fractures. Mild old approximate L1 compression fracture. Osteopenia. IMPRESSION: 1. Stable cardiomegaly with LEFT lung base atelectasis/scarring. 2. Large hiatal hernia. 3.  Aortic Atherosclerosis (ICD10-I70.0). Electronically Signed   By: Elon Alas M.D.   On: 02/03/2018 15:26  Ct Head Wo Contrast  Result Date: 02/03/2018 CLINICAL DATA:  Altered mental status. EXAM: CT HEAD WITHOUT CONTRAST TECHNIQUE: Contiguous axial images were obtained from the base of the skull through the vertex without intravenous contrast. COMPARISON:  None FINDINGS: Brain: No evidence of acute infarction, hemorrhage, hydrocephalus, extra-axial collection or mass lesion/mass effect. Prominence of the sulci and ventricles compatible with age related brain atrophy. There is mild diffuse low-attenuation within the subcortical and periventricular white matter compatible with chronic microvascular disease. Vascular: No hyperdense vessel or unexpected calcification. Skull: Normal. Negative for fracture or focal lesion. Sinuses/Orbits: No acute finding. Other:  None. IMPRESSION: 1. No acute intracranial abnormalities. 2. Chronic small vessel ischemic change and brain atrophy. Electronically Signed   By: Kerby Moors M.D.   On: 02/03/2018 15:50   Dg Chest Port 1 View  Result Date: 02/04/2018 CLINICAL DATA:  Respiratory failure EXAM: PORTABLE CHEST 1 VIEW COMPARISON:  Chest radiograph 02/03/2018 FINDINGS: Marked increase of bilateral airspace opacities, likely indicating pulmonary edema. No pneumothorax. Assessment for pleural effusions limited by positioning, but I suspect there are small bilateral pleural effusions. Hiatal hernia. Unchanged mediastinal silhouette. IMPRESSION: Marked worsening of bilateral airspace opacities. Given the short time interval since the prior study, pulmonary edema is favored. Electronically Signed   By: Ulyses Jarred M.D.   On: 02/04/2018 02:06   US Abdomen Limited Ruq  Result Date: 02/04/2018 CLINICAL DATA:  Elevated LFTs. EXAM: ULTRASOUND ABDOMEN LIMITED RIGHT UPPER QUADRANT COMPARISON:  No recent prior. FINDINGS: Gallbladder: 1.7 cm gallstone noted. Gallbladder is contracted. Gallbladder wall thickness 4 mm. This may be from contracted state. Cholecystitis cannot be completely excluded. Negative Murphy sign. Common bile duct: Diameter: 4.0 mm Liver: No focal lesion identified. Within normal limits in parenchymal echogenicity. Portal vein is patent on color Doppler imaging with normal direction of blood flow towards the liver. Exam was limited as the patient was uncooperative. IMPRESSION: 1.7 cm gallstone. Contracted gallbladder. Gallbladder wall thickness 4 mm. This may be from contracted state. Cholecystitis cannot be completely excluded. Negative Murphy sign. No biliary distention. Electronically Signed   By: Marcello Moores  Register   On: 02/04/2018 15:07    Microbiology: Recent Results (from the past 240 hour(s))  Urine culture     Status: None   Collection Time: 02/03/18  4:44 PM  Result Value Ref Range Status   Specimen  Description   Final    URINE, RANDOM Performed at [place] [address] [place], [place] [address]    Special Requests   Final    NONE Performed at [facility], [place] [address], [place], [place] [address]    Culture   Final    NO GROWTH Performed at [facility], [place] [address] [place], [place] [address]    Report Status 02/05/2018 FINAL  Final  Culture, blood (routine x 2)     Status: None   Collection Time: 02/03/18  5:39 PM  Result Value Ref Range Status   Specimen Description   Final    BLOOD RIGHT FOREARM Performed at [place] [address] [place], [place] [address]    Special Requests   Final    BOTTLES DRAWN AEROBIC AND ANAEROBIC Blood Culture adequate volume Performed at [place] [address] [place], [place] [address]    Culture   Final    NO GROWTH 5 DAYS Performed at [facility], [place] [address] [place], [place] [address]  Report Status 02/08/2018 FINAL  Final  Culture, blood (routine x 2)     Status: None   Collection Time: 02/03/18  5:46 PM  Result Value Ref Range Status   Specimen Description   Final    BLOOD RIGHT ANTECUBITAL Performed at [place] [address] [place], [place] [address]    Special Requests   Final    BOTTLES DRAWN AEROBIC AND ANAEROBIC Blood Culture results may not be optimal due to an excessive volume of blood received in culture bottles Performed at [place] [address] [place], [place] [address]    Culture   Final    NO GROWTH 5 DAYS Performed at [facility], [place] [address], [place], [place] [address]    Report Status 02/08/2018 FINAL  Final  MRSA PCR Screening     Status: None   Collection Time: 02/03/18  8:41 PM  Result Value Ref Range Status   MRSA by PCR NEGATIVE NEGATIVE Final    Comment:        The GeneXpert MRSA Assay (FDA approved for NASAL  specimens only), is one component of a comprehensive MRSA colonization surveillance program. It is not intended to diagnose MRSA infection nor to guide or monitor treatment for MRSA infections. Performed at [facility], [place] [address], [place], [place] [address]      Labs: Basic Metabolic Panel: Recent Labs  Lab 02/03/18 1350 02/04/18 0324 02/05/18 0337  NA 142 145 145  K 3.5 3.7 2.9*  CL 110 117* 116*  CO2 22 19* 19*  GLUCOSE 124* 151* 88  BUN 15 19 27*  CREATININE 1.15 1.19 1.48*  CALCIUM 8.6* 7.3* 7.0*   Liver Function Tests: Recent Labs  Lab 02/03/18 1350 02/04/18 0324 02/05/18 0337  AST 72* 195* 189*  ALT 35 83* 67*  ALKPHOS 79 70 55  BILITOT 3.0* 2.3* 1.5*  PROT 6.9 6.0* 5.6*  ALBUMIN 3.6 3.1* 2.8*   No results for input(s): LIPASE, AMYLASE in the last 168 hours. No results for input(s): AMMONIA in the last 168 hours. CBC: Recent Labs  Lab 02/03/18 1350 02/04/18 0324 02/05/18 0337  WBC 12.1* 11.4* 8.4  NEUTROABS 11.1*  --  7.3  HGB 9.8* 9.3* 9.2*  HCT 30.1* 29.6* 28.5*  MCV 91.5 94.6 92.5  PLT 142* 164 120*   Cardiac Enzymes: No results for input(s): CKTOTAL, CKMB, CKMBINDEX, TROPONINI in the last 168 hours. BNP: BNP (last 3 results) No results for input(s): BNP in the last 8760 hours.  ProBNP (last 3 results) No results for input(s): PROBNP in the last 8760 hours.  CBG: Recent Labs  Lab 02/04/18 0716 02/04/18 2152 02/05/18 0743  GLUCAP 108* 93 86       Signed:  Alma Friendly, MD [facility] 02/08/2018, 11:29 AM

## 2018-02-08 NOTE — Progress Notes (Signed)
Nutrition Brief Note  Patient with low Braden Score.  Chart reviewed. Pt transitioning to comfort care.  No nutrition interventions warranted at this time.   Clayton Bibles, MS, RD, [place] Dietitian Pager: [phone] After Hours Pager: [phone]

## 2018-02-08 NOTE — Progress Notes (Signed)
LCSW following for residential hospice placement.  Patient has bed at [facility] today.   Patient will transport by [facility].   LCSW faxed dc docs to facility.   RN report number [id number]  Servando Snare, [place] [phone]

## 2018-03-06 DEATH — deceased

## 2019-01-23 IMAGING — CR DG CHEST 2V
3 series · 3 of 3 positions shown · non-contrast
Comparison: Chest radiograph May 21, 2017

CLINICAL DATA: Slid out of bed.  Suspected urinary tract infection.

EXAM:
CHEST - 2 VIEW

[w chest lat (1 of 2)]
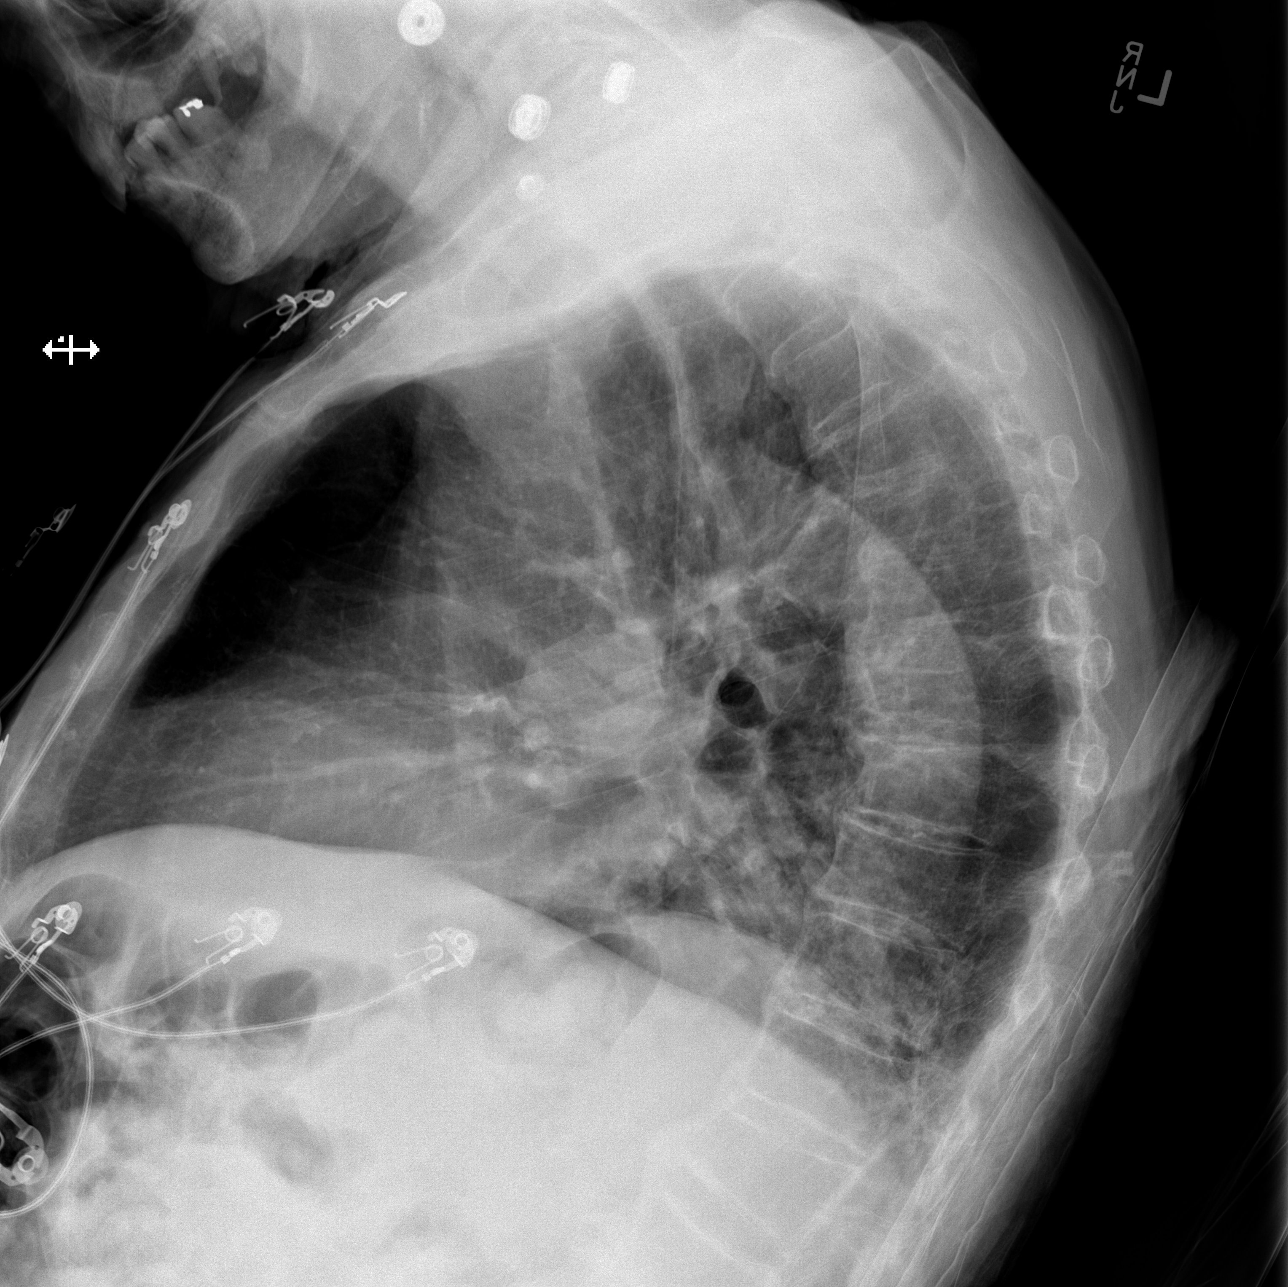

[w chest lat (2 of 2)]
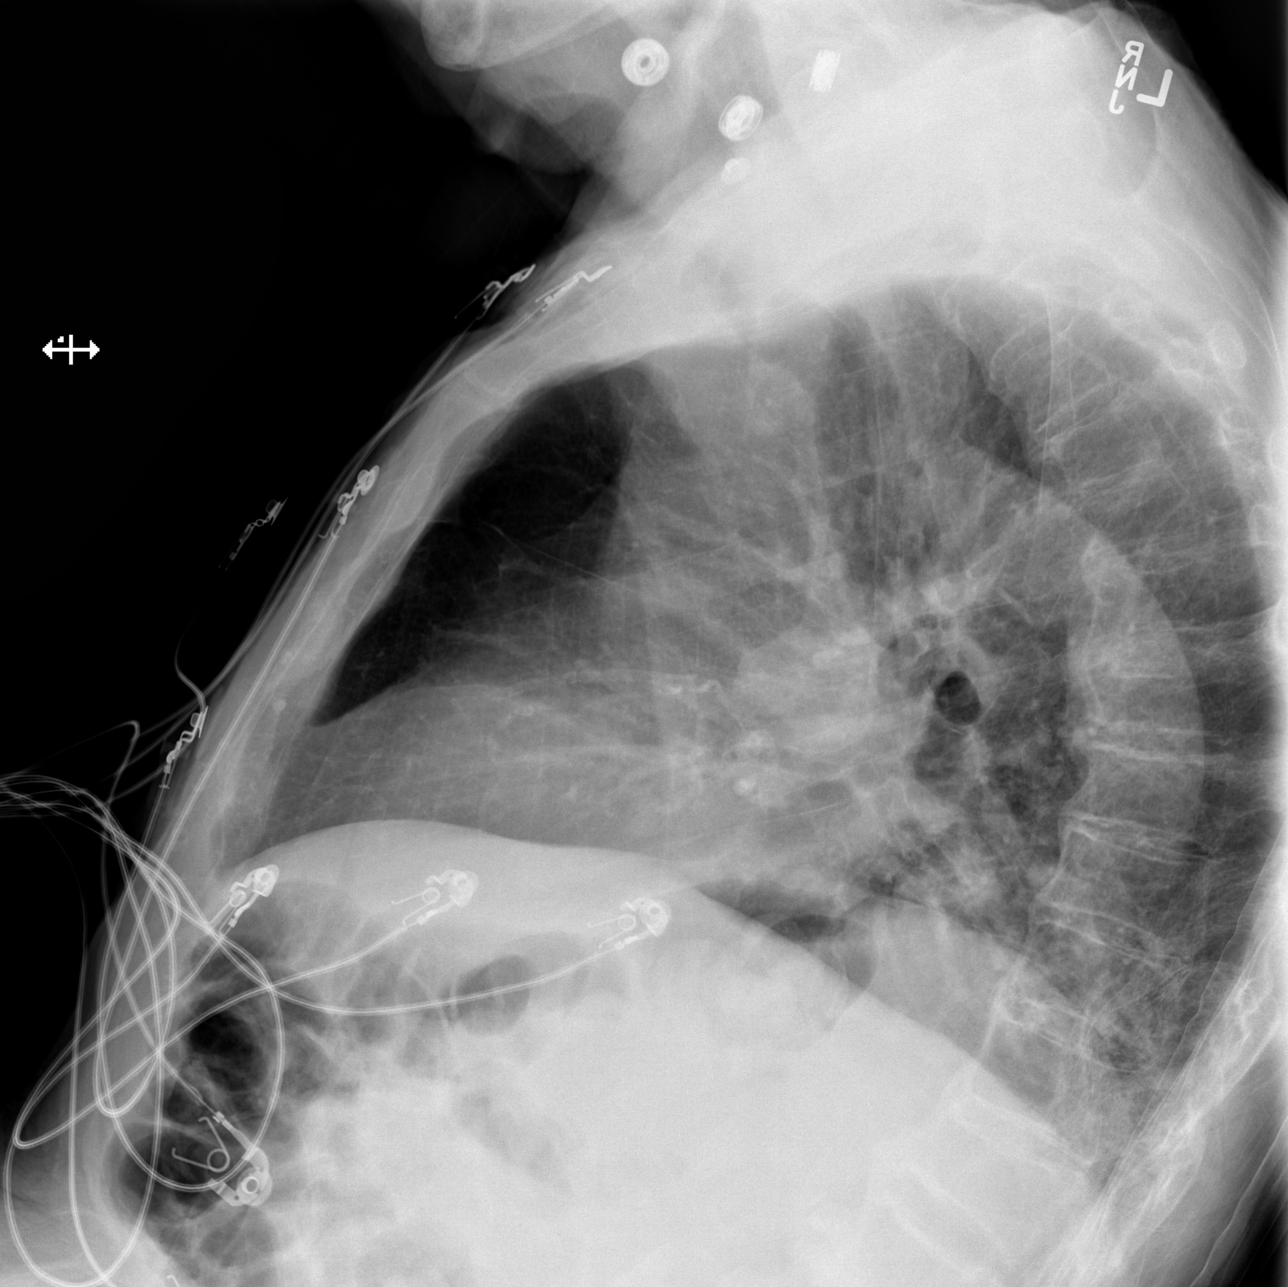

[x chest ap]
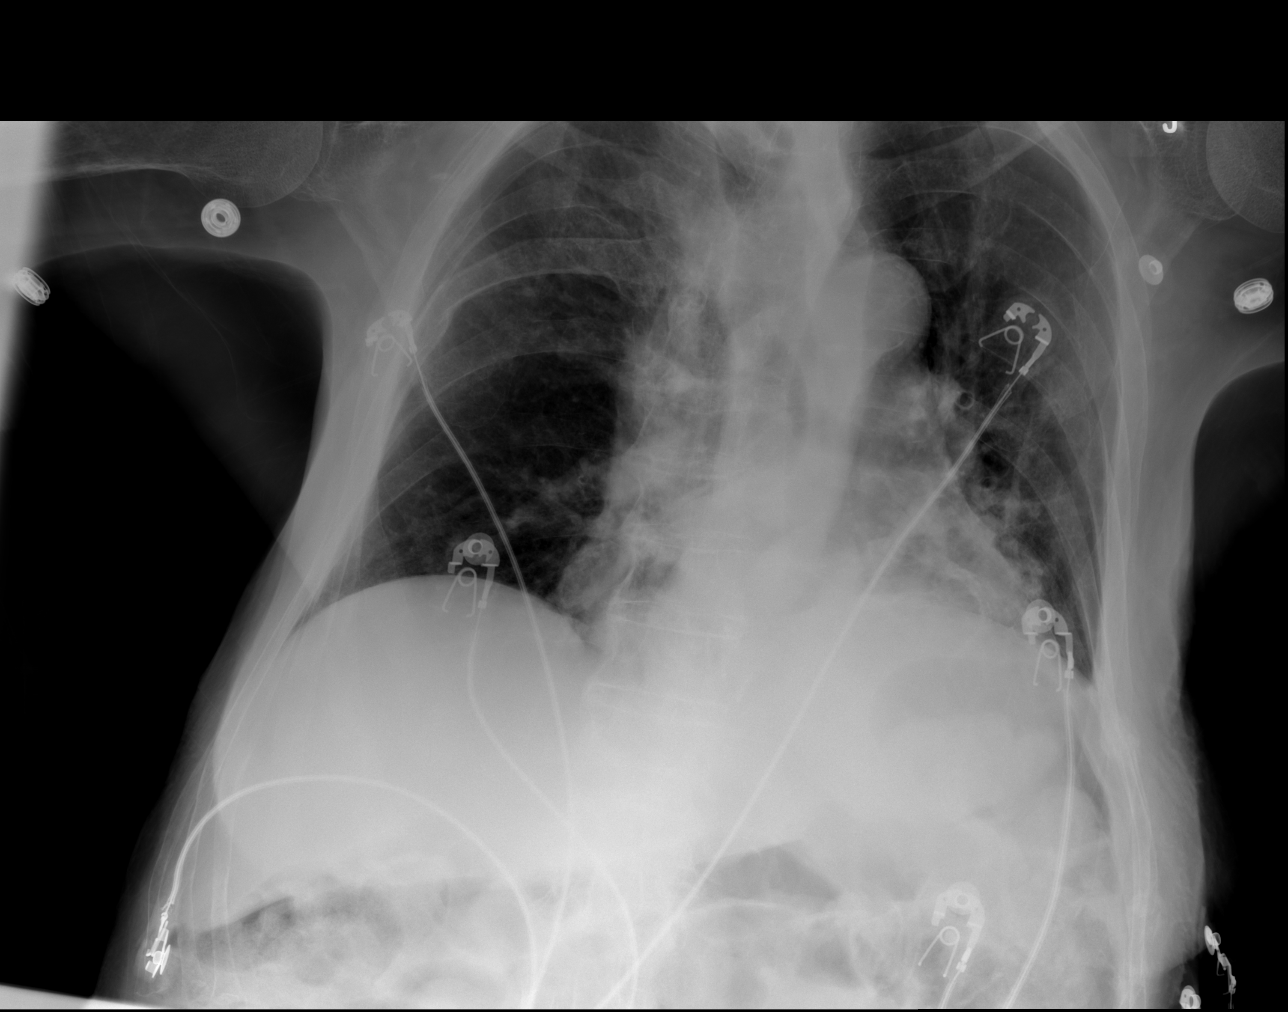

[3 of 3 positions shown; findings below may reference images not displayed]

FINDINGS: Cardiac silhouette is mildly enlarged and unchanged. Coronary artery
calcification and/or stent. Large hiatal hernia. Calcified aortic
arch. No pleural effusion or focal consolidation. LEFT lung base
atelectasis/scarring. No pneumothorax. Old RIGHT and healing LEFT
rib fractures. Mild old approximate L1 compression fracture.
Osteopenia.
IMPRESSION: 1. Stable cardiomegaly with LEFT lung base atelectasis/scarring.
2. Large hiatal hernia.
3.  Aortic Atherosclerosis (HD1CG-B75.5).

## 2019-01-24 IMAGING — US US ABDOMEN LIMITED
1 series · 14 of 25 positions shown · non-contrast
Comparison: No recent prior.

CLINICAL DATA: Elevated LFTs.

EXAM:
ULTRASOUND ABDOMEN LIMITED RIGHT UPPER QUADRANT

[Series 1: us abdomen limited · 14 of 32 slices shown]
[im 1/32]
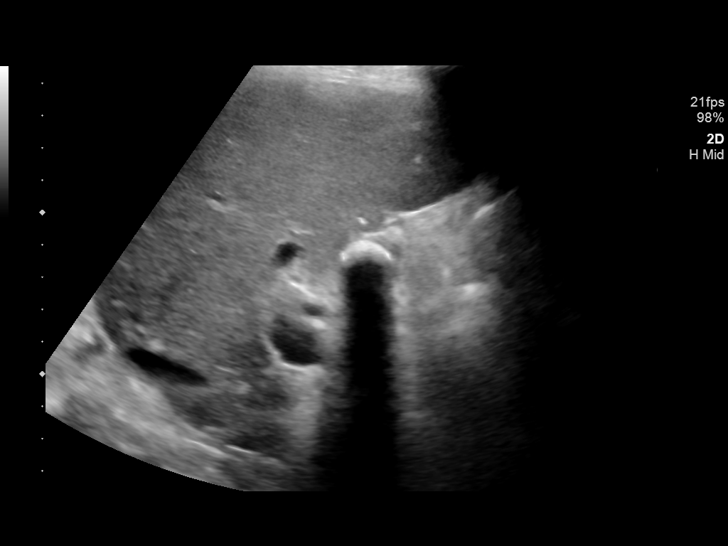
[im 3/32]
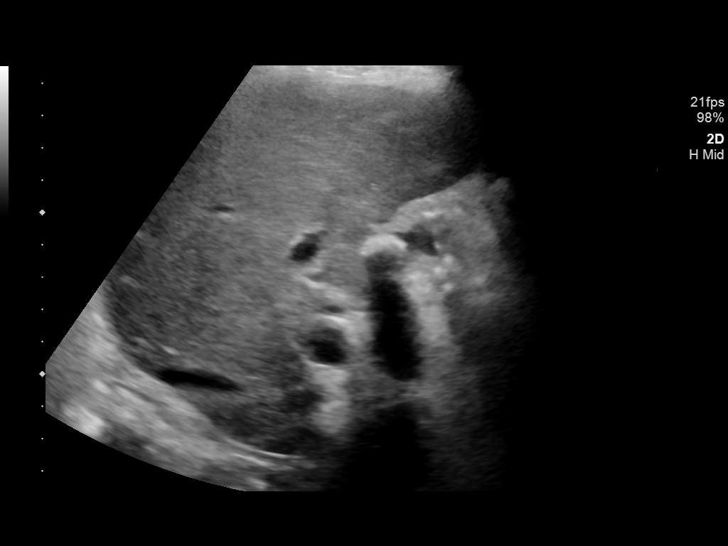
[im 6/32]
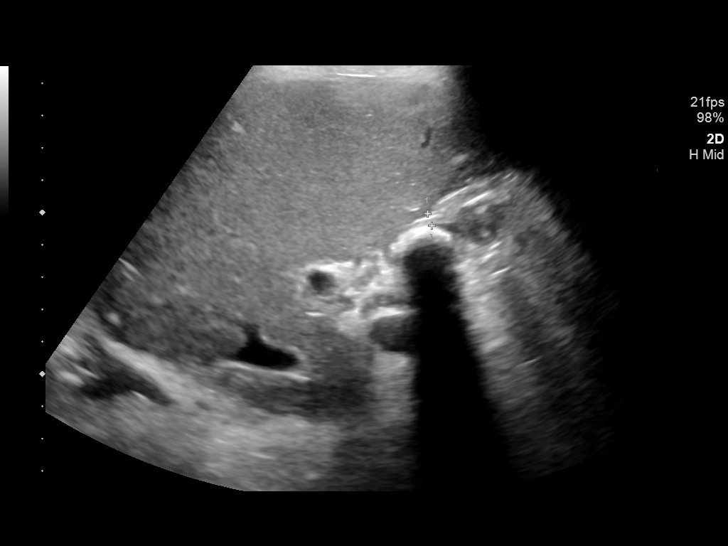
[im 8/32]
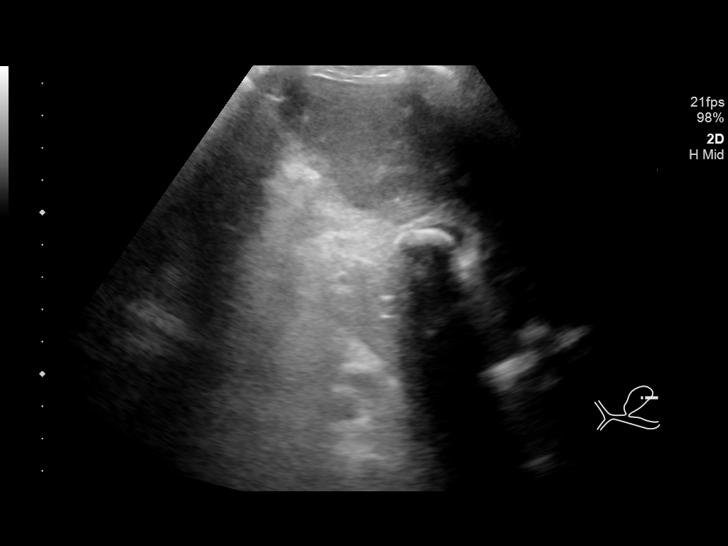
[im 11/32]
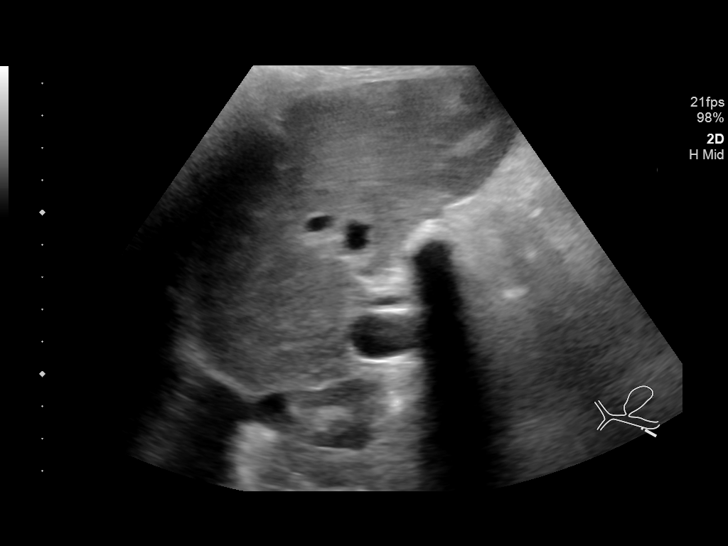
[im 12/32]
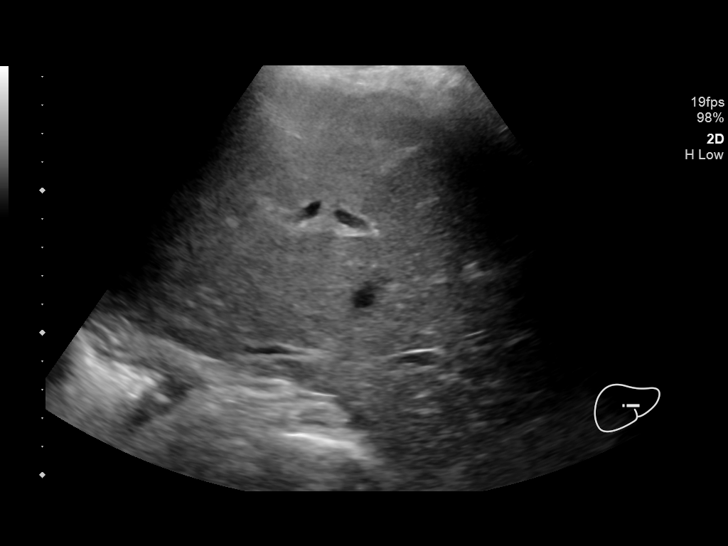
[im 15/32]
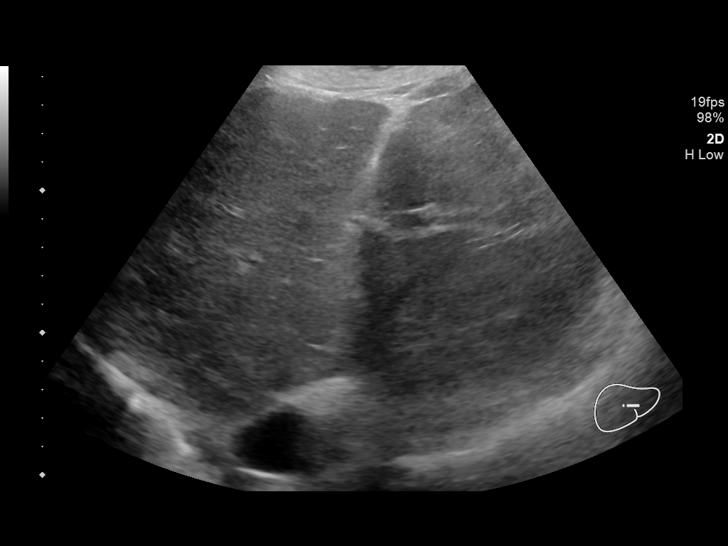
[im 17/32]
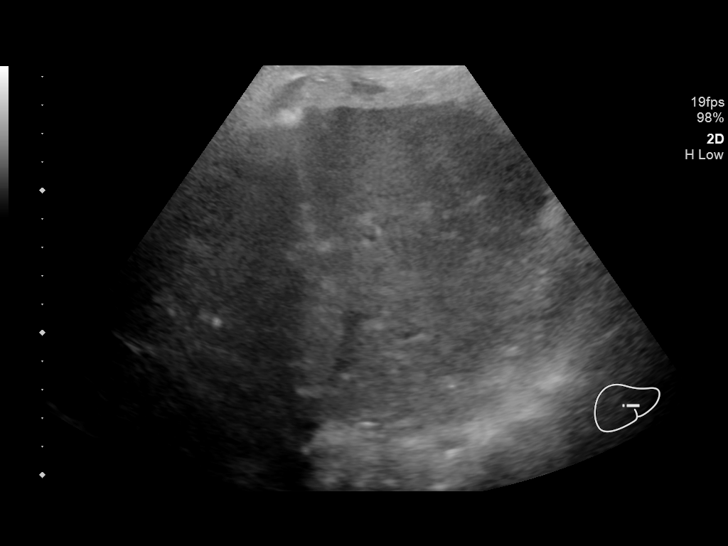
[im 20/32]
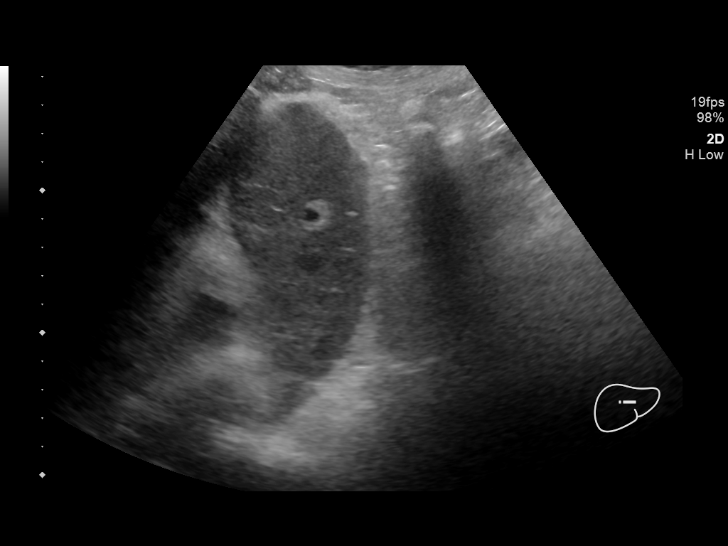
[im 21/32]
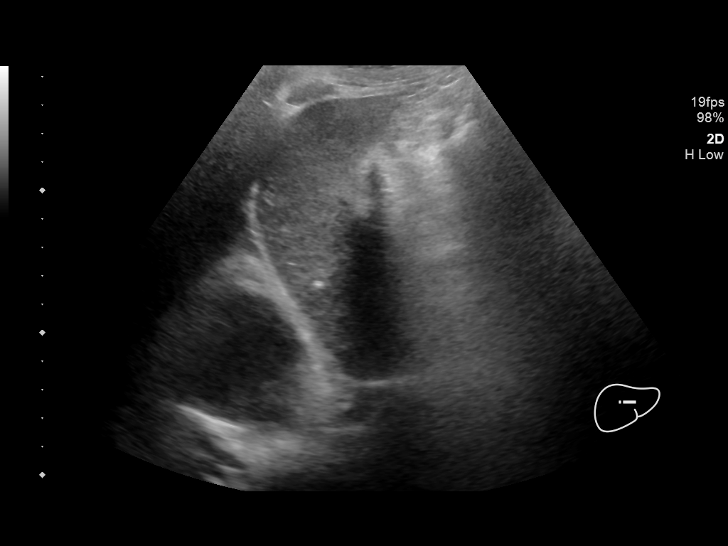
[im 24/32]
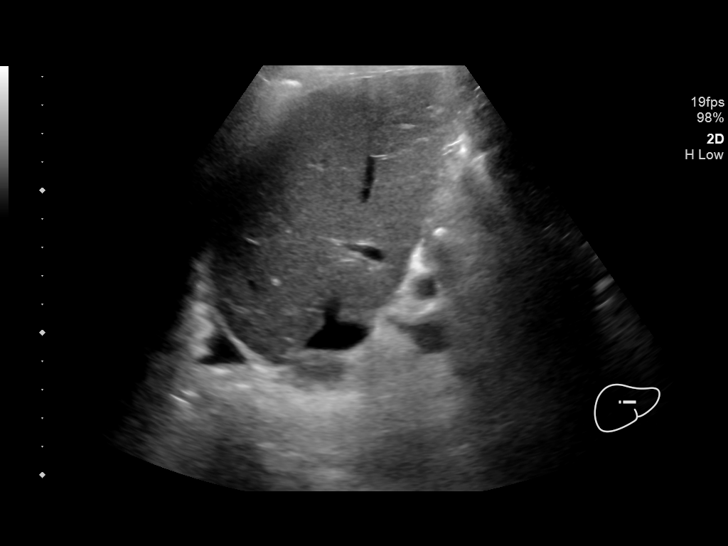
[im 26/32]
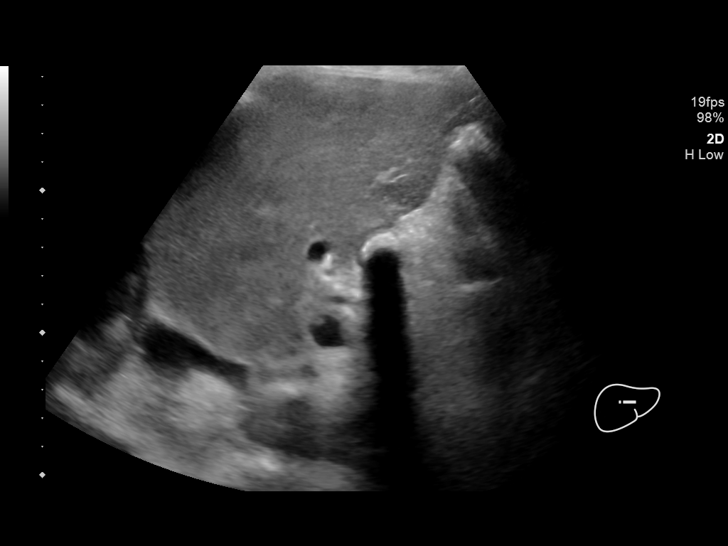
[im 29/32]
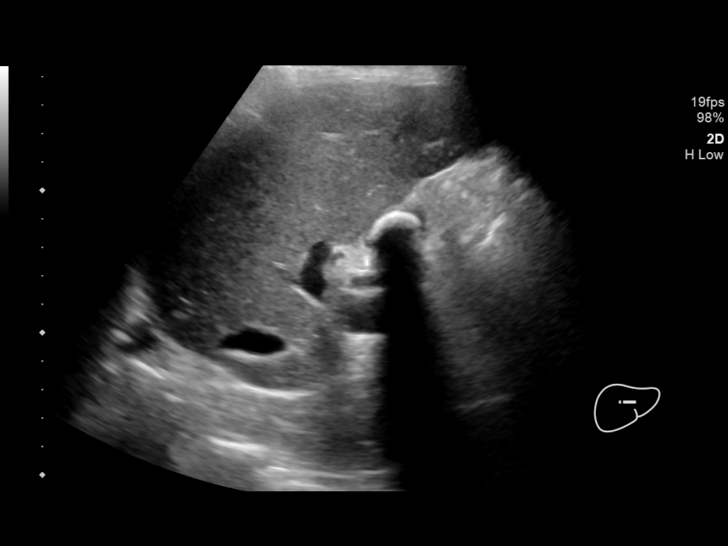
[im 32/32]
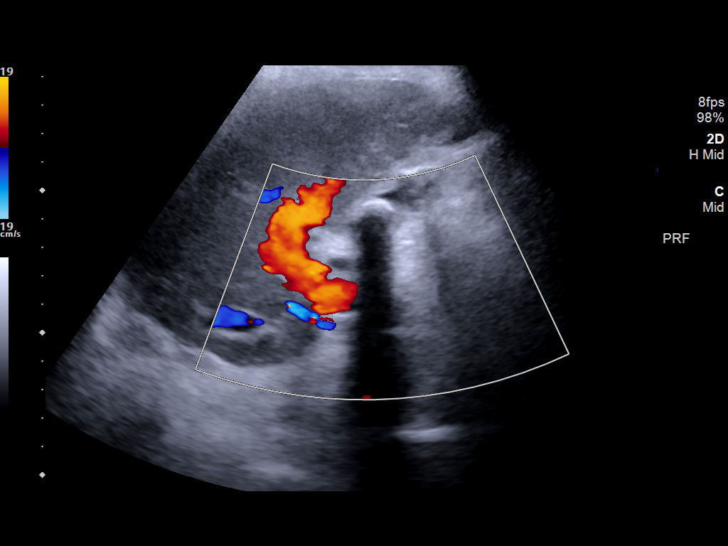

[14 of 25 positions shown; findings below may reference images not displayed]

FINDINGS: Gallbladder:

1.7 cm gallstone noted. Gallbladder is contracted. Gallbladder wall
thickness 4 mm. This may be from contracted state. Cholecystitis
cannot be completely excluded. Negative Murphy sign.

Common bile duct:

Diameter: 4.0 mm

Liver:

No focal lesion identified. Within normal limits in parenchymal
echogenicity. Portal vein is patent on color Doppler imaging with
normal direction of blood flow towards the liver. Exam was limited
as the patient was uncooperative.
IMPRESSION: 1.7 cm gallstone. Contracted gallbladder. Gallbladder wall thickness
4 mm. This may be from contracted state. Cholecystitis cannot be
completely excluded. Negative Murphy sign. No biliary distention.
